# Patient Record
Sex: Female | Born: 1995
Health system: Southern US, Community
[De-identification: ages and names within clinical notes are randomized; demographics above are authoritative.]

## PROBLEM LIST (undated history)

## (undated) DIAGNOSIS — J302 Other seasonal allergic rhinitis: Secondary | ICD-10-CM

## (undated) DIAGNOSIS — O24419 Gestational diabetes mellitus in pregnancy, unspecified control: Secondary | ICD-10-CM

## (undated) HISTORY — PX: OTHER SURGICAL HISTORY: SHX169

---
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
## 2018-01-03 ENCOUNTER — Other Ambulatory Visit: Payer: Self-pay

## 2018-01-03 ENCOUNTER — Encounter (HOSPITAL_BASED_OUTPATIENT_CLINIC_OR_DEPARTMENT_OTHER): Payer: Self-pay

## 2018-01-03 ENCOUNTER — Emergency Department (HOSPITAL_BASED_OUTPATIENT_CLINIC_OR_DEPARTMENT_OTHER)
Admission: EM | Admit: 2018-01-03 | Discharge: 2018-01-03 | Disposition: A | Discharge status: Home / Self Care | Payer: Medicaid Other | Attending: Emergency Medicine | Admitting: Emergency Medicine

## 2018-01-03 DIAGNOSIS — N760 Acute vaginitis: Secondary | ICD-10-CM | POA: Diagnosis not present

## 2018-01-03 DIAGNOSIS — F17200 Nicotine dependence, unspecified, uncomplicated: Secondary | ICD-10-CM | POA: Diagnosis not present

## 2018-01-03 DIAGNOSIS — N939 Abnormal uterine and vaginal bleeding, unspecified: Secondary | ICD-10-CM | POA: Diagnosis not present

## 2018-01-03 DIAGNOSIS — B9689 Other specified bacterial agents as the cause of diseases classified elsewhere: Secondary | ICD-10-CM

## 2018-01-03 DIAGNOSIS — R102 Pelvic and perineal pain: Secondary | ICD-10-CM | POA: Insufficient documentation

## 2018-01-03 LAB — URINALYSIS, ROUTINE W REFLEX MICROSCOPIC
Bilirubin Urine: NEGATIVE
GLUCOSE, UA: NEGATIVE mg/dL
Ketones, ur: NEGATIVE mg/dL
Leukocytes, UA: NEGATIVE
Nitrite: NEGATIVE
PH: 6 (ref 5.0–8.0)
Protein, ur: NEGATIVE mg/dL
SPECIFIC GRAVITY, URINE: 1.025 (ref 1.005–1.030)

## 2018-01-03 LAB — WET PREP, GENITAL
Sperm: NONE SEEN
TRICH WET PREP: NONE SEEN
YEAST WET PREP: NONE SEEN

## 2018-01-03 LAB — URINALYSIS, MICROSCOPIC (REFLEX)

## 2018-01-03 LAB — PREGNANCY, URINE: Preg Test, Ur: NEGATIVE

## 2018-01-03 LAB — HCG, QUANTITATIVE, PREGNANCY: hCG, Beta Chain, Quant, S: <1 m[IU]/mL (ref <5)

## 2018-01-03 MED ORDER — NAPROXEN 375 MG PO TABS: 375.0000 mg | ORAL_TABLET | Freq: Two times a day (BID) | ORAL | 0 refills | Status: DC

## 2018-01-03 MED ORDER — METRONIDAZOLE 500 MG PO TABS: 500.0000 mg | ORAL_TABLET | Freq: Two times a day (BID) | ORAL | 0 refills | Status: DC

## 2018-01-03 MED ORDER — DICYCLOMINE HCL 20 MG PO TABS: 20.0000 mg | ORAL_TABLET | Freq: Two times a day (BID) | ORAL | 0 refills | Status: DC

## 2018-01-03 MED ORDER — ACETAMINOPHEN 500 MG PO TABS
1000.0000 mg | ORAL_TABLET | Freq: Once | ORAL | Status: AC
  Administered 2018-01-03: 1000 mg via ORAL
  Filled 2018-01-03: qty 2

## 2018-01-03 MED FILL — NAPROXEN 375 MG TABLET: 375 | 10 days supply | Qty: 20 | Fill #0

## 2018-01-03 MED FILL — metroNIDAZOLE 500 MG TABS: 500 | 7 days supply | Qty: 14 | Fill #0

## 2018-01-03 MED FILL — DICYCLOMINE 20 MG TABLET: 20 | 10 days supply | Qty: 20 | Fill #0

## 2018-01-03 NOTE — Discharge Instructions (Signed)
Please see the information and instructions below regarding your visit.  Your diagnoses today include:  1. Pelvic pain in female   2. BV (bacterial vaginosis)     Tests performed today include: See side panel of your discharge paperwork for testing performed today. Vital signs are listed at the bottom of these instructions.   Your wet prep demonstrated clue cells, which are an overgrowth of normal bacteria in the vagina.  Your gonorrhea, chlamydia, HIV, and syphilis testing will be available in the next 48 hours.  If anything is positive requires further action, you will receive a phone call.  Medications prescribed:    Take any prescribed medications only as prescribed, and any over the counter medications only as directed on the packaging.  Flagyl is an antibiotic used to treat bacterial vaginosis. This medication CANNOT be taken with alcohol, because it can cause nausea and vomiting combined with alcohol. Please also refrain from drinking alcohol for 48 hours after you finish the medicine.  Bentyl.  This is a medication to reduce spasming.  Naproxen.  This is a medication that helps with crampy lower abdominal pain.  This medication cannot be taken if trying to get pregnant.   Home care instructions:  Please follow any educational materials contained in this packet.   Follow-up instructions: Please follow-up with your primary care provider in 5-7 days for further evaluation of your symptoms if they are not completely improved.    Return instructions:  Please return to the Emergency Department if you experience worsening symptoms.  Please return to the emergency department if you have any worsening pain, nausea or vomiting that prevents you from keeping anything down, or significant increase in vaginal bleeding. Please return if you have any other emergent concerns.  Additional Information: If you wish to pursue contraceptive therapy, please discuss with your primary care  provider.  I also recommend wearing a condom with all sexual encounters to prevent any sexual transmitted infection transmission.  Your vital signs today were: BP 125/71 (BP Location: Right Arm)    Pulse 67    Temp 97.9 F (36.6 C) (Oral)    Resp 18    Ht 5\' 4"  (1.626 m)    Wt 92.5 kg (204 lb)    LMP 01/03/2018    SpO2 99%    BMI 35.02 kg/m  If your blood pressure (BP) was elevated on multiple readings during this visit above 130 for the top number or above 80 for the bottom number, please have this repeated by your primary care provider within one month. --------------  Thank you for allowing us to participate in your care today.

## 2018-01-03 NOTE — ED Triage Notes (Signed)
Pt states she is having pelvic/lower abd pain and vaginal bleeding x 1 hour-pt states she is due for menstrual period-NAD-steady gait

## 2018-01-03 NOTE — ED Notes (Signed)
ED Provider at bedside. 
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
## 2018-01-04 LAB — GC/CHLAMYDIA PROBE AMP (~~LOC~~) NOT AT ARMC
CHLAMYDIA, DNA PROBE: NEGATIVE
NEISSERIA GONORRHEA: NEGATIVE

## 2018-01-04 LAB — RPR: RPR: NONREACTIVE

## 2018-01-04 LAB — HIV ANTIBODY (ROUTINE TESTING W REFLEX): HIV SCREEN 4TH GENERATION: NONREACTIVE

## 2018-07-19 ENCOUNTER — Other Ambulatory Visit: Payer: Self-pay

## 2018-07-19 ENCOUNTER — Encounter (HOSPITAL_BASED_OUTPATIENT_CLINIC_OR_DEPARTMENT_OTHER): Payer: Self-pay | Admitting: *Deleted

## 2018-07-19 ENCOUNTER — Emergency Department (HOSPITAL_BASED_OUTPATIENT_CLINIC_OR_DEPARTMENT_OTHER)
Admission: EM | Admit: 2018-07-19 | Discharge: 2018-07-19 | Disposition: A | Discharge status: Home / Self Care | Payer: Medicaid Other | Attending: Emergency Medicine | Admitting: Emergency Medicine

## 2018-07-19 DIAGNOSIS — J111 Influenza due to unidentified influenza virus with other respiratory manifestations: Secondary | ICD-10-CM | POA: Insufficient documentation

## 2018-07-19 DIAGNOSIS — Z87891 Personal history of nicotine dependence: Secondary | ICD-10-CM | POA: Diagnosis not present

## 2018-07-19 DIAGNOSIS — R05 Cough: Secondary | ICD-10-CM | POA: Diagnosis present

## 2018-07-19 DIAGNOSIS — M7918 Myalgia, other site: Secondary | ICD-10-CM | POA: Insufficient documentation

## 2018-07-19 DIAGNOSIS — R69 Illness, unspecified: Secondary | ICD-10-CM

## 2018-07-19 DIAGNOSIS — Z3A09 9 weeks gestation of pregnancy: Secondary | ICD-10-CM | POA: Insufficient documentation

## 2018-07-19 DIAGNOSIS — O99511 Diseases of the respiratory system complicating pregnancy, first trimester: Secondary | ICD-10-CM | POA: Diagnosis not present

## 2018-07-19 MED ORDER — ACETAMINOPHEN 325 MG PO TABS
650.0000 mg | ORAL_TABLET | Freq: Once | ORAL | Status: AC
  Administered 2018-07-19: 650 mg via ORAL
  Filled 2018-07-19: qty 2

## 2018-07-19 NOTE — ED Provider Notes (Signed)
[facility] EMERGENCY DEPARTMENT Provider Note   CSN: [id number] Arrival date & time: 07/19/18  1608     History   Chief Complaint Chief Complaint  Patient presents with  . URI    HPI Wanda Boone is a 23 y.o. female.  23 y.o female G2P1 currently 9 weeks pregnany with no PMH presents to the ED with a chief complaint of cough, rhinorrhea and body aches x 3 days. She reports a clear to yellow sputum to her cough along with constant rhinorrhea and overall pain. She reports taking tylenol for her symptoms with no relieve. She also states not receiving her flu shot this season. She denies any chest pain, shortness of breath but reports feeling overall unwell.        Past Medical History:  Diagnosis Date  . Seasonal allergies     There are no active problems to display for this patient.   Past Surgical History:  Procedure Laterality Date  . arm surgery  cyst removed  . sweat gland surgery       OB History    Gravida  1   Para      Term      Preterm      AB      Living        SAB      TAB      Ectopic      Multiple      Live Births               Home Medications    Prior to Admission medications   Medication Sig Start Date End Date Taking? Authorizing Provider  dicyclomine (BENTYL) 20 MG tablet Take 1 tablet (20 mg total) by mouth 2 (two) times daily. 01/03/18   Aviva Kluver B, PA-C  naproxen (NAPROSYN) 375 MG tablet Take 1 tablet (375 mg total) by mouth 2 (two) times daily. 01/03/18   Elisha Ponder, PA-C    Family History History reviewed. No pertinent family history.  Social History Social History   Tobacco Use  . Smoking status: Former Games developer  . Smokeless tobacco: Never Used  Substance Use Topics  . Alcohol use: No  . Drug use: Never     Allergies   Cherry flavor   Review of Systems Review of Systems  Constitutional: Positive for fever.  HENT: Negative for sore throat.   Respiratory: Positive for cough.  Negative for shortness of breath.      Physical Exam Updated Vital Signs BP 105/63 (BP Location: Left Arm)   Pulse 97   Temp 99.8 F (37.7 C) (Oral)   Resp 18   Ht 5\' 4"  (1.626 m)   Wt 93 kg   LMP 05/15/2018   SpO2 100%   BMI 35.19 kg/m   Physical Exam Vitals signs and nursing note reviewed.  Constitutional:      General: She is not in acute distress.    Appearance: She is well-developed. She is ill-appearing.  HENT:     Head: Normocephalic and atraumatic.     Nose:     Right Turbinates: Enlarged and swollen.     Left Turbinates: Enlarged and swollen.     Right Sinus: Maxillary sinus tenderness and frontal sinus tenderness present.     Left Sinus: Maxillary sinus tenderness and frontal sinus tenderness present.     Mouth/Throat:     Mouth: Mucous membranes are moist.     Pharynx: Posterior oropharyngeal erythema present. No pharyngeal swelling  or oropharyngeal exudate.     Tonsils: No tonsillar exudate or tonsillar abscesses.     Comments: Oropharynx is clear slight erythema.  Eyes:     Pupils: Pupils are equal, round, and reactive to light.  Neck:     Musculoskeletal: Normal range of motion.  Cardiovascular:     Rate and Rhythm: Regular rhythm.     Heart sounds: Normal heart sounds.  Pulmonary:     Effort: Pulmonary effort is normal. No respiratory distress.     Breath sounds: Decreased breath sounds present. No wheezing.     Comments: No wheezing, rhonchi, rales.  Abdominal:     General: Bowel sounds are normal. There is no distension.     Palpations: Abdomen is soft.     Tenderness: There is no abdominal tenderness.  Musculoskeletal:        General: No tenderness or deformity.     Right lower leg: No edema.     Left lower leg: No edema.  Skin:    General: Skin is warm and dry.  Neurological:     Mental Status: She is alert and oriented to person, place, and time.      ED Treatments / Results  Labs (all labs ordered are listed, but only abnormal  results are displayed) Labs Reviewed - No data to display  EKG None  Radiology No results found.  Procedures Procedures (including critical care time)  Medications Ordered in ED Medications  acetaminophen (TYLENOL) tablet 650 mg (650 mg Oral Given 07/19/18 1715)     Initial Impression / Assessment and Plan / ED Course  I have reviewed the triage vital signs and the nursing notes.  Pertinent labs & imaging results that were available during my care of the patient were reviewed by me and considered in my medical decision making (see chart for details).     Resents with URI symptoms which began 2 days ago, fever along with rhinorrhea and cough, has tried Tylenol for her symptoms with no improvement.  She reports she would like to get some rest.  Patient is currently [redacted] weeks pregnant I have discussed the risks and benefits of Tamiflu and patient has denied a Tamiflu prescription at this time.  She is requesting Acacian for her headache however I have advised her that this time unable to provide anything but Tylenol due to be her being in the first trimester.  Her vitals have been stable she is afebrile at this moment, will have her follow-up with her PCP as needed encouraged to increase her fluid intake along with return if anything worsens.  Final Clinical Impressions(s) / ED Diagnoses   Final diagnoses:  Influenza-like illness    ED Discharge Orders    None       Claude Manges, Cordelia Poche 07/19/18 1952    Loren Racer, MD 07/21/18 [phone]

## 2018-07-19 NOTE — ED Triage Notes (Signed)
Pt c/o URi symptoms x 3 days  

## 2018-07-19 NOTE — Discharge Instructions (Addendum)
You may only take tylenol for your fever and symptoms. Please continue to hydrate with fluids and gatorade. Follow up with your primary care as needed.

## 2018-08-19 ENCOUNTER — Emergency Department (HOSPITAL_BASED_OUTPATIENT_CLINIC_OR_DEPARTMENT_OTHER)
Admission: EM | Admit: 2018-08-19 | Discharge: 2018-08-19 | Disposition: A | Discharge status: Home / Self Care | Payer: Medicaid Other | Attending: Emergency Medicine | Admitting: Emergency Medicine

## 2018-08-19 ENCOUNTER — Other Ambulatory Visit: Payer: Self-pay

## 2018-08-19 ENCOUNTER — Encounter (HOSPITAL_BASED_OUTPATIENT_CLINIC_OR_DEPARTMENT_OTHER): Payer: Self-pay | Admitting: Emergency Medicine

## 2018-08-19 DIAGNOSIS — A599 Trichomoniasis, unspecified: Secondary | ICD-10-CM | POA: Diagnosis not present

## 2018-08-19 DIAGNOSIS — G44219 Episodic tension-type headache, not intractable: Secondary | ICD-10-CM | POA: Diagnosis not present

## 2018-08-19 DIAGNOSIS — R51 Headache: Secondary | ICD-10-CM | POA: Diagnosis present

## 2018-08-19 DIAGNOSIS — Z87891 Personal history of nicotine dependence: Secondary | ICD-10-CM | POA: Diagnosis not present

## 2018-08-19 DIAGNOSIS — N3000 Acute cystitis without hematuria: Secondary | ICD-10-CM | POA: Diagnosis not present

## 2018-08-19 LAB — URINALYSIS, ROUTINE W REFLEX MICROSCOPIC
Bilirubin Urine: NEGATIVE
GLUCOSE, UA: NEGATIVE mg/dL
HGB URINE DIPSTICK: NEGATIVE
KETONES UR: NEGATIVE mg/dL
Nitrite: NEGATIVE
Protein, ur: NEGATIVE mg/dL
Specific Gravity, Urine: 1.02 (ref 1.005–1.030)
pH: 6 (ref 5.0–8.0)

## 2018-08-19 LAB — URINALYSIS, MICROSCOPIC (REFLEX): Squamous Epithelial / LPF: >50 (ref 0–5)

## 2018-08-19 MED ORDER — METRONIDAZOLE 500 MG PO TABS: 500.0000 mg | ORAL_TABLET | Freq: Two times a day (BID) | ORAL | 0 refills | Status: DC

## 2018-08-19 MED ORDER — CEPHALEXIN 500 MG PO CAPS: 500.0000 mg | ORAL_CAPSULE | Freq: Two times a day (BID) | ORAL | 0 refills | Status: DC

## 2018-08-19 MED ORDER — ONDANSETRON 4 MG PO TBDP: ORAL_TABLET | ORAL | 0 refills | Status: DC

## 2018-08-19 NOTE — ED Triage Notes (Signed)
Patient states that she has had a headache for 4 days  - the patient states that she is almost 14 weeks  - patient also reports that she is nauseated  - patient took tylenol 3 hours ago - the patient is talking and answering her phone while in triage and no noted neurological deficits

## 2018-08-19 NOTE — ED Notes (Signed)
Sprite given per pt request. 

## 2018-08-19 NOTE — ED Provider Notes (Signed)
[facility] EMERGENCY DEPARTMENT Provider Note   CSN: [id number] Arrival date & time: 08/19/18  1723     History   Chief Complaint Chief Complaint  Patient presents with  . Headache    HPI Wanda Boone is a 23 y.o. female.  Patient is a 23 year old female who presents with a headache.  She states for the last 4 days she has had intermittent headaches.  She describes an achy pain in her bifrontal area.  She states the headaches last about 2 to 3 hours but then usually subside after that.  She has been taking Tylenol with intermittent improvement in symptoms.  She denies any fevers.  No neck pain.  No head injuries recently.  No URI symptoms or sinus congestion.  No associated nausea or vomiting although she did have one episode of vomiting today which she does not know if it was related to the headache or because she is pregnant.  She is had no ongoing nausea.  No photophobia.  No history of similar headaches in the past.  No numbness or weakness to her extremities.  No facial numbness.  No problems with her balance.  No vision changes.  She is currently [redacted] weeks pregnant.  She is followed by [facility].     Past Medical History:  Diagnosis Date  . Seasonal allergies     There are no active problems to display for this patient.   Past Surgical History:  Procedure Laterality Date  . arm surgery  cyst removed  . sweat gland surgery       OB History    Gravida  1   Para      Term      Preterm      AB      Living        SAB      TAB      Ectopic      Multiple      Live Births               Home Medications    Prior to Admission medications   Medication Sig Start Date End Date Taking? Authorizing Provider  cephALEXin (KEFLEX) 500 MG capsule Take 1 capsule (500 mg total) by mouth 2 (two) times daily. 08/19/18   Rolan Bucco, MD  dicyclomine (BENTYL) 20 MG tablet Take 1 tablet (20 mg total) by mouth 2 (two) times daily. 01/03/18    Aviva Kluver B, PA-C  metroNIDAZOLE (FLAGYL) 500 MG tablet Take 1 tablet (500 mg total) by mouth 2 (two) times daily. One po bid x 7 days 08/19/18   Rolan Bucco, MD  naproxen (NAPROSYN) 375 MG tablet Take 1 tablet (375 mg total) by mouth 2 (two) times daily. 01/03/18   Aviva Kluver B, PA-C  ondansetron (ZOFRAN ODT) 4 MG disintegrating tablet 4mg  ODT q4 hours prn nausea/vomit 08/19/18   Rolan Bucco, MD    Family History History reviewed. No pertinent family history.  Social History Social History   Tobacco Use  . Smoking status: Former Games developer  . Smokeless tobacco: Never Used  Substance Use Topics  . Alcohol use: No  . Drug use: Never     Allergies   Cherry flavor   Review of Systems Review of Systems  Constitutional: Negative for chills, diaphoresis, fatigue and fever.  HENT: Negative for congestion, rhinorrhea and sneezing.   Eyes: Negative.   Respiratory: Negative for cough, chest tightness and shortness of breath.   Cardiovascular: Negative  for chest pain and leg swelling.  Gastrointestinal: Positive for nausea and vomiting (1 time today). Negative for abdominal pain, blood in stool and diarrhea.  Genitourinary: Negative for difficulty urinating, flank pain, frequency and hematuria.  Musculoskeletal: Negative for arthralgias and back pain.  Skin: Negative for rash.  Neurological: Positive for headaches. Negative for dizziness, speech difficulty, weakness and numbness.     Physical Exam Updated Vital Signs BP (!) 99/55   Pulse 72   Temp 98.4 F (36.9 C) (Oral)   Resp 16   Ht 5\' 4"  (1.626 m)   Wt 93 kg   LMP 05/15/2018   SpO2 100%   BMI 35.19 kg/m   Physical Exam Constitutional:      Appearance: She is well-developed.  HENT:     Head: Normocephalic and atraumatic.  Eyes:     Pupils: Pupils are equal, round, and reactive to light.     Comments: Fundi not well visualized, no photophobia  Neck:     Musculoskeletal: Normal range of motion and neck  supple.     Comments: No meningismus Cardiovascular:     Rate and Rhythm: Normal rate and regular rhythm.     Heart sounds: Normal heart sounds.  Pulmonary:     Effort: Pulmonary effort is normal. No respiratory distress.     Breath sounds: Normal breath sounds. No wheezing or rales.  Chest:     Chest wall: No tenderness.  Abdominal:     General: Bowel sounds are normal.     Palpations: Abdomen is soft.     Tenderness: There is no abdominal tenderness. There is no guarding or rebound.  Musculoskeletal: Normal range of motion.  Lymphadenopathy:     Cervical: No cervical adenopathy.  Skin:    General: Skin is warm and dry.     Findings: No rash.  Neurological:     Mental Status: She is alert and oriented to person, place, and time.     Comments: Motor 5/5 all extremities Sensation grossly intact to LT all extremities Finger to Nose intact, no pronator drift CN II-XII grossly intact Gait normal       ED Treatments / Results  Labs (all labs ordered are listed, but only abnormal results are displayed) Labs Reviewed  URINALYSIS, ROUTINE W REFLEX MICROSCOPIC - Abnormal; Notable for the following components:      Result Value   APPearance CLOUDY (*)    Leukocytes, UA SMALL (*)    All other components within normal limits  URINALYSIS, MICROSCOPIC (REFLEX) - Abnormal; Notable for the following components:   Bacteria, UA MANY (*)    Trichomonas, UA PRESENT (*)    All other components within normal limits    EKG None  Radiology No results found.  Procedures Procedures (including critical care time)  Medications Ordered in ED Medications - No data to display   Initial Impression / Assessment and Plan / ED Course  I have reviewed the triage vital signs and the nursing notes.  Pertinent labs & imaging results that were available during my care of the patient were reviewed by me and considered in my medical decision making (see chart for details).     Patient  presents with a intermittent bifrontal type headache.  She has no associated neck pain fever or other symptoms that would be more concerning for subarachnoid hemorrhage or meningitis.  She states her headache has almost resolved after taking Tylenol about 3 hours ago.  She has no neurologic deficits.  No ataxia.  No vision changes.  No other symptoms concerning for preeclampsia.  Her urinalysis did show evidence of a UTI as well as trichomonas.  She was started on Flagyl and Keflex.  She was encouraged to have close follow-up with her OB/GYN.  She was advised that her sexual partner needs to be treated as well.  Return precautions were given.  Final Clinical Impressions(s) / ED Diagnoses   Final diagnoses:  Episodic tension-type headache, not intractable  Trichimoniasis  Acute cystitis without hematuria    ED Discharge Orders         Ordered    metroNIDAZOLE (FLAGYL) 500 MG tablet  2 times daily     08/19/18 2241    cephALEXin (KEFLEX) 500 MG capsule  2 times daily     08/19/18 2241    ondansetron (ZOFRAN ODT) 4 MG disintegrating tablet     08/19/18 2241           Rolan BuccoBelfi, Kennette Cuthrell, MD 08/19/18 2242

## 2020-09-08 ENCOUNTER — Other Ambulatory Visit: Payer: Self-pay

## 2020-09-08 ENCOUNTER — Emergency Department (HOSPITAL_BASED_OUTPATIENT_CLINIC_OR_DEPARTMENT_OTHER)
Admission: EM | Admit: 2020-09-08 | Discharge: 2020-09-08 | Disposition: A | Discharge status: Home / Self Care | Payer: Medicaid Other | Attending: Emergency Medicine | Admitting: Emergency Medicine

## 2020-09-08 ENCOUNTER — Encounter (HOSPITAL_BASED_OUTPATIENT_CLINIC_OR_DEPARTMENT_OTHER): Payer: Self-pay | Admitting: Emergency Medicine

## 2020-09-08 DIAGNOSIS — K0889 Other specified disorders of teeth and supporting structures: Secondary | ICD-10-CM | POA: Insufficient documentation

## 2020-09-08 DIAGNOSIS — Z87891 Personal history of nicotine dependence: Secondary | ICD-10-CM | POA: Insufficient documentation

## 2020-09-08 MED ORDER — HYDROCODONE-ACETAMINOPHEN 5-325 MG PO TABS: 1.0000 | ORAL_TABLET | Freq: Four times a day (QID) | ORAL | 0 refills | Status: DC | PRN

## 2020-09-08 MED ORDER — FLUCONAZOLE 150 MG PO TABS: ORAL_TABLET | ORAL | 0 refills | Status: AC

## 2020-09-08 MED ORDER — BUPIVACAINE-EPINEPHRINE (PF) 0.5% -1:200000 IJ SOLN
1.8000 mL | Freq: Once | INTRAMUSCULAR | Status: AC
  Administered 2020-09-08: 1.8 mL
  Filled 2020-09-08: qty 1.8

## 2020-09-08 MED ORDER — AMOXICILLIN 500 MG PO CAPS: 500.0000 mg | ORAL_CAPSULE | Freq: Three times a day (TID) | ORAL | 0 refills | Status: AC

## 2020-09-08 MED ORDER — AMOXICILLIN 500 MG PO CAPS
1000.0000 mg | ORAL_CAPSULE | Freq: Once | ORAL | Status: AC
  Administered 2020-09-08: 1000 mg via ORAL
  Filled 2020-09-08: qty 2

## 2020-09-08 NOTE — ED Triage Notes (Signed)
Pt c/o tooth pain since yesterday

## 2020-09-08 NOTE — ED Provider Notes (Signed)
[facility] Provider Note: Lowella Dell, MD, FACEP  CSN: [id number] MRN: [id number] ARRIVAL: 09/08/20 at 0206 ROOM: MH10/MH10   CHIEF COMPLAINT  Dental Pain   HISTORY OF PRESENT ILLNESS  09/08/20 3:01 AM Wanda Boone is a 25 y.o. female with pain in her left upper central incisor since yesterday.  She rates her pain is a 10 out of 10, worse with eating or drinking.  She is not aware of any injury or caries associated with that tooth.  She does not have a Education officer, community.   Past Medical History:  Diagnosis Date  . Seasonal allergies     Past Surgical History:  Procedure Laterality Date  . arm surgery  cyst removed  . sweat gland surgery      No family history on file.  Social History   Tobacco Use  . Smoking status: Former Games developer  . Smokeless tobacco: Never Used  Substance Use Topics  . Alcohol use: No  . Drug use: Never    Prior to Admission medications   Medication Sig Start Date End Date Taking? Authorizing Provider  amoxicillin (AMOXIL) 500 MG capsule Take 1 capsule (500 mg total) by mouth 3 (three) times daily. 09/08/20  Yes Thania Woodlief, MD  fluconazole (DIFLUCAN) 150 MG tablet Take 1 tablet if needed for vaginal yeast infection.  May repeat in 3 days if symptoms persist. 09/08/20  Yes Eyana Stolze, Jonny Ruiz, MD  HYDROcodone-acetaminophen (NORCO) 5-325 MG tablet Take 1 tablet by mouth every 6 (six) hours as needed (for pain). 09/08/20  Yes Delphin Funes, MD  dicyclomine (BENTYL) 20 MG tablet Take 1 tablet (20 mg total) by mouth 2 (two) times daily. 01/03/18 09/08/20  Elisha Ponder, PA-C    Allergies Cherry flavor   REVIEW OF SYSTEMS  Negative except as noted here or in the History of Present Illness.   PHYSICAL EXAMINATION  Initial Vital Signs Blood pressure 112/67, pulse 80, temperature 98.3 F (36.8 C), temperature source Oral, resp. rate 20, height 5\' 4"  (1.626 m), weight 99.8 kg, SpO2 98 %.  Examination General: Well-developed, well-nourished  female in no acute distress; appearance consistent with age of record HENT: normocephalic; atraumatic; left upper central incisor tender to percussion, no obvious fracture or caries seen Eyes: pupils equal, round and reactive to light; extraocular muscles intact Neck: supple; no lymphadenopathy Heart: regular rate and rhythm Lungs: clear to auscultation bilaterally Abdomen: soft; nondistended; nontender; bowel sounds present Extremities: No deformity; full range of motion Neurologic: Awake, alert and oriented; motor function intact in all extremities and symmetric; no facial droop Skin: Warm and dry Psychiatric: Normal mood and affect   RESULTS  Summary of this visit's results, reviewed and interpreted by myself:   EKG Interpretation  Date/Time:    Ventricular Rate:    PR Interval:    QRS Duration:   QT Interval:    QTC Calculation:   R Axis:     Text Interpretation:        Laboratory Studies: No results found for this or any previous visit (from the past 24 hour(s)). Imaging Studies: No results found.  ED COURSE and MDM  Nursing notes, initial and subsequent vitals signs, including pulse oximetry, reviewed and interpreted by myself.  Vitals:   09/08/20 0214 09/08/20 0216  BP:  112/67  Pulse:  80  Resp:  20  Temp:  98.3 F (36.8 C)  TempSrc:  Oral  SpO2:  98%  Weight: 99.8 kg   Height: 5\' 4"  (1.626 m)  Medications  amoxicillin (AMOXIL) capsule 1,000 mg (has no administration in time range)  bupivacaine-epinephrine (MARCAINE W/ EPI) 0.5% -1:200000 injection 1.8 mL (1.8 mLs Infiltration Given 09/08/20 0308)    We will start on amoxicillin and analgesics and referred to the dentist on-call.  PROCEDURES  Procedures DENTAL BLOCK 1.8 milliliters of 0.5% bupivacaine with epinephrine were injected into the buccal fold adjacent to the left upper central incisor. The patient tolerated this well and there were no immediate complications. Adequate analgesia was  obtained.   ED DIAGNOSES     ICD-10-CM   1. Pain, dental  K08.89        Paula Libra, MD 09/08/20 [phone]

## 2021-05-18 ENCOUNTER — Emergency Department (HOSPITAL_BASED_OUTPATIENT_CLINIC_OR_DEPARTMENT_OTHER)
Admission: EM | Admit: 2021-05-18 | Discharge: 2021-05-18 | Disposition: A | Discharge status: Home / Self Care | Payer: Medicaid Other | Attending: Emergency Medicine | Admitting: Emergency Medicine

## 2021-05-18 ENCOUNTER — Other Ambulatory Visit: Payer: Self-pay

## 2021-05-18 ENCOUNTER — Encounter (HOSPITAL_BASED_OUTPATIENT_CLINIC_OR_DEPARTMENT_OTHER): Payer: Self-pay

## 2021-05-18 ENCOUNTER — Emergency Department (HOSPITAL_BASED_OUTPATIENT_CLINIC_OR_DEPARTMENT_OTHER): Payer: Medicaid Other

## 2021-05-18 DIAGNOSIS — Z91010 Allergy to peanuts: Secondary | ICD-10-CM | POA: Diagnosis not present

## 2021-05-18 DIAGNOSIS — Z87891 Personal history of nicotine dependence: Secondary | ICD-10-CM | POA: Insufficient documentation

## 2021-05-18 DIAGNOSIS — R42 Dizziness and giddiness: Secondary | ICD-10-CM | POA: Diagnosis not present

## 2021-05-18 DIAGNOSIS — J111 Influenza due to unidentified influenza virus with other respiratory manifestations: Secondary | ICD-10-CM

## 2021-05-18 DIAGNOSIS — R112 Nausea with vomiting, unspecified: Secondary | ICD-10-CM | POA: Diagnosis not present

## 2021-05-18 DIAGNOSIS — J1089 Influenza due to other identified influenza virus with other manifestations: Secondary | ICD-10-CM | POA: Insufficient documentation

## 2021-05-18 DIAGNOSIS — R8271 Bacteriuria: Secondary | ICD-10-CM | POA: Diagnosis not present

## 2021-05-18 LAB — BASIC METABOLIC PANEL
Anion gap: 12 (ref 5–15)
BUN: 9 mg/dL (ref 6–20)
CO2: 23 mmol/L (ref 22–32)
Calcium: 9.5 mg/dL (ref 8.9–10.3)
Chloride: 102 mmol/L (ref 98–111)
Creatinine, Ser: 0.73 mg/dL (ref 0.44–1.00)
GFR, Estimated: >60 mL/min (ref >60)
Glucose, Bld: 99 mg/dL (ref 70–99)
Potassium: 3.5 mmol/L (ref 3.5–5.1)
Sodium: 137 mmol/L (ref 135–145)

## 2021-05-18 LAB — URINALYSIS, ROUTINE W REFLEX MICROSCOPIC
Glucose, UA: NEGATIVE mg/dL
Ketones, ur: 40 mg/dL — AB
Nitrite: NEGATIVE
Protein, ur: 100 mg/dL — AB
Specific Gravity, Urine: <30 (ref 1.005–1.030)
pH: 6 (ref 5.0–8.0)

## 2021-05-18 LAB — CBC
HCT: 43.9 % (ref 36.0–46.0)
Hemoglobin: 15.2 g/dL — ABNORMAL HIGH (ref 12.0–15.0)
MCH: 31.7 pg (ref 26.0–34.0)
MCHC: 34.6 g/dL (ref 30.0–36.0)
MCV: 91.6 fL (ref 80.0–100.0)
Platelets: 285 10*3/uL (ref 150–400)
RBC: 4.79 MIL/uL (ref 3.87–5.11)
RDW: 11.6 % (ref 11.5–15.5)
WBC: 7.6 10*3/uL (ref 4.0–10.5)
nRBC: 0 % (ref 0.0–0.2)

## 2021-05-18 LAB — URINALYSIS, MICROSCOPIC (REFLEX)

## 2021-05-18 LAB — RAPID URINE DRUG SCREEN, HOSP PERFORMED
Amphetamines: NOT DETECTED
Barbiturates: NOT DETECTED
Benzodiazepines: NOT DETECTED
Cocaine: NOT DETECTED
Opiates: NOT DETECTED
Tetrahydrocannabinol: POSITIVE — AB

## 2021-05-18 LAB — PREGNANCY, URINE: Preg Test, Ur: NEGATIVE

## 2021-05-18 LAB — CBG MONITORING, ED: Glucose-Capillary: 98 mg/dL (ref 70–99)

## 2021-05-18 MED ORDER — SODIUM CHLORIDE 0.9 % IV BOLUS
1000.0000 mL | Freq: Once | INTRAVENOUS | Status: AC
  Administered 2021-05-18: 1000 mL via INTRAVENOUS

## 2021-05-18 MED ORDER — CEPHALEXIN 500 MG PO CAPS: 500.0000 mg | ORAL_CAPSULE | Freq: Two times a day (BID) | ORAL | 0 refills | Status: AC

## 2021-05-18 MED ORDER — DIPHENHYDRAMINE HCL 50 MG/ML IJ SOLN
25.0000 mg | Freq: Once | INTRAMUSCULAR | Status: AC
  Administered 2021-05-18: 25 mg via INTRAVENOUS
  Filled 2021-05-18: qty 1

## 2021-05-18 MED ORDER — DROPERIDOL 2.5 MG/ML IJ SOLN
2.5000 mg | Freq: Once | INTRAMUSCULAR | Status: AC
  Administered 2021-05-18: 2.5 mg via INTRAMUSCULAR
  Filled 2021-05-18: qty 2

## 2021-05-18 MED ORDER — ONDANSETRON HCL 4 MG/2ML IJ SOLN
4.0000 mg | Freq: Once | INTRAMUSCULAR | Status: AC
  Administered 2021-05-18: 4 mg via INTRAVENOUS
  Filled 2021-05-18: qty 2

## 2021-05-18 NOTE — ED Triage Notes (Signed)
Pt arrives ambulatory to ED with reports of testing positive for the flu last week states she has been vomiting and feeling dizzy since.

## 2021-05-18 NOTE — Discharge Instructions (Addendum)
Your urine could possibly be infected.  I have started you on antibiotics.  Would also continue to use the Zofran and Phenergan  Return for new or worsening symptoms

## 2021-05-18 NOTE — ED Provider Notes (Addendum)
[facility] EMERGENCY DEPARTMENT Provider Note   CSN: [id number] Arrival date & time: 05/18/21  1231    History Chief Complaint  Patient presents with   Influenza    Wanda Boone is a 25 y.o. female with a past medical history who presents for evaluation of feeling unwell.  Tested positive for flu at [facility] greater than a week ago.  States she has not felt well since.  States she has had intermittent vomiting and feels lightheaded when she goes from sitting to standing.  She was seen for similar at their facility.  States she has ODT Zofran as well as Phenergan at home which are not helping.  She denies any marijuana use, EtOH use.  No headache, head trauma, neck pain, neck stiffness, chest pain, shortness of breath abdominal pain, diarrhea, dysuria.  Denies additional aggravating or alleviating factors.  History obtained from patient and past medical records.  No interpreter is used.  HPI     Past Medical History:  Diagnosis Date   Seasonal allergies     There are no problems to display for this patient.   Past Surgical History:  Procedure Laterality Date   arm surgery  cyst removed   sweat gland surgery       OB History     Gravida  1   Para      Term      Preterm      AB      Living         SAB      IAB      Ectopic      Multiple      Live Births              No family history on file.  Social History   Tobacco Use   Smoking status: Former   Smokeless tobacco: Never  Building services engineer Use: Never used  Substance Use Topics   Alcohol use: No   Drug use: Never    Home Medications Prior to Admission medications   Medication Sig Start Date End Date Taking? Authorizing Provider  amoxicillin (AMOXIL) 500 MG capsule Take 1 capsule (500 mg total) by mouth 3 (three) times daily. 09/08/20   Molpus, Jonny Ruiz, MD  cephALEXin (KEFLEX) 500 MG capsule Take 1 capsule (500 mg total) by mouth 2 (two) times daily for 7 days. 05/18/21  05/25/21 Yes Sophie Tamez A, PA-C  fluconazole (DIFLUCAN) 150 MG tablet Take 1 tablet if needed for vaginal yeast infection.  May repeat in 3 days if symptoms persist. 09/08/20   Molpus, Jonny Ruiz, MD  HYDROcodone-acetaminophen (NORCO) 5-325 MG tablet Take 1 tablet by mouth every 6 (six) hours as needed (for pain). 09/08/20   Molpus, John, MD  dicyclomine (BENTYL) 20 MG tablet Take 1 tablet (20 mg total) by mouth 2 (two) times daily. 01/03/18 09/08/20  Aviva Kluver B, PA-C    Allergies    Peanut butter flavor, Red dye, and Cherry flavor  Review of Systems   Review of Systems  HENT: Negative.    Respiratory: Negative.    Cardiovascular: Negative.   Gastrointestinal:  Positive for nausea and vomiting. Negative for abdominal distention, abdominal pain, anal bleeding, constipation, diarrhea and rectal pain.  Genitourinary: Negative.   Musculoskeletal: Negative.   Skin: Negative.   Neurological: Negative.   All other systems reviewed and are negative.  Physical Exam Updated Vital Signs BP 119/85   Pulse 78   Temp 99.1 F (  37.3 C) (Oral)   Resp 16   Ht 5\' 4"  (1.626 m)   Wt 99.8 kg   LMP 05/17/2021   SpO2 100%   BMI 37.76 kg/m   Physical Exam Vitals and nursing note reviewed.  Constitutional:      General: She is not in acute distress.    Appearance: She is well-developed. She is not ill-appearing.  HENT:     Head: Normocephalic and atraumatic.     Jaw: There is normal jaw occlusion.     Nose: Nose normal.     Mouth/Throat:     Mouth: Mucous membranes are moist.  Eyes:     Pupils: Pupils are equal, round, and reactive to light.  Neck:     Trachea: Trachea and phonation normal.     Comments: Full ROM without difficulty Cardiovascular:     Rate and Rhythm: Normal rate.     Pulses: Normal pulses.          Radial pulses are 2+ on the right side and 2+ on the left side.       Dorsalis pedis pulses are 2+ on the right side.     Heart sounds: Normal heart sounds.  Pulmonary:      Effort: Pulmonary effort is normal. No respiratory distress.     Breath sounds: Normal breath sounds and air entry.     Comments: Clear bil, speaking in full sentences without difficulty Abdominal:     General: Bowel sounds are normal. There is no distension.     Palpations: Abdomen is soft.     Tenderness: There is no abdominal tenderness. There is no right CVA tenderness, left CVA tenderness, guarding or rebound.     Comments: Non tender  Musculoskeletal:        General: No swelling or tenderness. Normal range of motion.     Cervical back: Full passive range of motion without pain and normal range of motion.     Comments: Full ROM without difficulty. No posterior calf tenderness  Skin:    General: Skin is warm and dry.     Capillary Refill: Capillary refill takes less than 2 seconds.     Comments: No obvious rash  Neurological:     General: No focal deficit present.     Mental Status: She is alert.     Cranial Nerves: Cranial nerves 2-12 are intact.     Comments: Cn 2-12 grossly intact. Ambulatory without ataxia  Psychiatric:        Mood and Affect: Mood normal.    ED Results / Procedures / Treatments   Labs (all labs ordered are listed, but only abnormal results are displayed) Labs Reviewed  CBC - Abnormal; Notable for the following components:      Result Value   Hemoglobin 15.2 (*)    All other components within normal limits  URINALYSIS, ROUTINE W REFLEX MICROSCOPIC - Abnormal; Notable for the following components:   Color, Urine AMBER (*)    APPearance CLOUDY (*)    Hgb urine dipstick TRACE (*)    Bilirubin Urine MODERATE (*)    Ketones, ur 40 (*)    Protein, ur 100 (*)    Leukocytes,Ua TRACE (*)    All other components within normal limits  RAPID URINE DRUG SCREEN, HOSP PERFORMED - Abnormal; Notable for the following components:   Tetrahydrocannabinol POSITIVE (*)    All other components within normal limits  URINALYSIS, MICROSCOPIC (REFLEX) - Abnormal; Notable  for the following components:  Bacteria, UA MANY (*)    All other components within normal limits  BASIC METABOLIC PANEL  PREGNANCY, URINE  CBG MONITORING, ED    EKG EKG Interpretation  Date/Time:  Tuesday May 18 2021 12:42:36 EDT Ventricular Rate:  93 PR Interval:  166 QRS Duration: 84 QT Interval:  362 QTC Calculation: 450 R Axis:   71 Text Interpretation: Normal sinus rhythm Confirmed by Virgina Norfolk ([id number]) on 05/18/2021 1:24:50 PM  Radiology DG Chest Portable 1 View  Result Date: 05/18/2021 CLINICAL DATA:  Cough. EXAM: PORTABLE CHEST 1 VIEW COMPARISON:  None. FINDINGS: The heart size and mediastinal contours are within normal limits. Both lungs are clear. The visualized skeletal structures are unremarkable. IMPRESSION: No active disease. Electronically Signed   By: Signa Kell M.D.   On: 05/18/2021 13:42    Procedures Procedures   Medications Ordered in ED Medications  sodium chloride 0.9 % bolus 1,000 mL (0 mLs Intravenous Stopped 05/18/21 1549)  ondansetron (ZOFRAN) injection 4 mg (4 mg Intravenous Given 05/18/21 1437)  sodium chloride 0.9 % bolus 1,000 mL (1,000 mLs Intravenous New Bag/Given 05/18/21 1622)  droperidol (INAPSINE) 2.5 MG/ML injection 2.5 mg (2.5 mg Intramuscular Given 05/18/21 1623)  diphenhydrAMINE (BENADRYL) injection 25 mg (25 mg Intravenous Given 05/18/21 1656)   ED Course  I have reviewed the triage vital signs and the nursing notes.  Pertinent labs & imaging results that were available during my care of the patient were reviewed by me and considered in my medical decision making (see chart for details).  Here for evaluation of feeling unwell with nausea and emesis.  Diagnosed with flu 1 week ago.  He is afebrile, nonseptic, not ill-appearing.  Does have some orthostatic lightheadedness.  States she has tried Zofran and Phenergan at home without relief of her emesis.  No abdominal pain, denies chance of pregnancy.  No urinary complaints.  Heart  and lungs clear.  Abdomen soft, nontender.  No headache, head trauma.  Denies any illicit substance use. Plan on labs, imaging and reassess.  Labs and imaging personally reviewed and interpreted:  CBC without leukocytosis Metabolic panel without electrolyte abnormality Preg test negative UA with leuks, many bacteria. Will treat for UTI.>> Denies concern for STD  Patient reassessed.  Received 2 L IV fluids.  UDS is positive for THC, suspect some degree of cannabinoid hyperemesis contributing.  Encourage continuing Zofran, Phenergan at home, ABX for UTI with rest and close follow-up outpatient with PCP.  No indication of appendicitis, bowel obstruction, bowel perforation, cholecystitis, diverticulitis, PID or ectopic pregnancy.    The patient has been appropriately medically screened and/or stabilized in the ED. I have low suspicion for any other emergent medical condition which would require further screening, evaluation or treatment in the ED or require inpatient management.  Patient is hemodynamically stable and in no acute distress.  Patient able to ambulate in department prior to ED.  Evaluation does not show acute pathology that would require ongoing or additional emergent interventions while in the emergency department or further inpatient treatment.  I have discussed the diagnosis with the patient and answered all questions.  Pain is been managed while in the emergency department and patient has no further complaints prior to discharge.  Patient is comfortable with plan discussed in room and is stable for discharge at this time.  I have discussed strict return precautions for returning to the emergency department.  Patient was encouraged to follow-up with PCP/specialist refer to at discharge.      MDM  Rules/Calculators/A&P                            Final Clinical Impression(s) / ED Diagnoses Final diagnoses:  Nausea and vomiting, unspecified vomiting type  Influenza  Bacteria in  urine    Rx / DC Orders ED Discharge Orders          Ordered    cephALEXin (KEFLEX) 500 MG capsule  2 times daily        05/18/21 1810             Nicolaas Savo A, PA-C 05/18/21 1816    Perrin Gens A, PA-C 05/18/21 1816    Curatolo, Adam, DO 05/21/21 0710

## 2022-03-24 ENCOUNTER — Emergency Department (HOSPITAL_BASED_OUTPATIENT_CLINIC_OR_DEPARTMENT_OTHER)
Admission: EM | Admit: 2022-03-24 | Discharge: 2022-03-24 | Disposition: A | Discharge status: Home / Self Care | Payer: Medicaid Other | Attending: Emergency Medicine | Admitting: Emergency Medicine

## 2022-03-24 ENCOUNTER — Other Ambulatory Visit: Payer: Self-pay

## 2022-03-24 ENCOUNTER — Encounter (HOSPITAL_BASED_OUTPATIENT_CLINIC_OR_DEPARTMENT_OTHER): Payer: Self-pay | Admitting: Emergency Medicine

## 2022-03-24 DIAGNOSIS — O219 Vomiting of pregnancy, unspecified: Secondary | ICD-10-CM | POA: Insufficient documentation

## 2022-03-24 DIAGNOSIS — Z20822 Contact with and (suspected) exposure to covid-19: Secondary | ICD-10-CM | POA: Insufficient documentation

## 2022-03-24 DIAGNOSIS — R112 Nausea with vomiting, unspecified: Secondary | ICD-10-CM

## 2022-03-24 DIAGNOSIS — Z3A01 Less than 8 weeks gestation of pregnancy: Secondary | ICD-10-CM | POA: Diagnosis not present

## 2022-03-24 LAB — RESP PANEL BY RT-PCR (FLU A&B, COVID) ARPGX2
Influenza A by PCR: NEGATIVE
Influenza B by PCR: NEGATIVE
SARS Coronavirus 2 by RT PCR: NEGATIVE

## 2022-03-24 MED ORDER — DOXYLAMINE-PYRIDOXINE 10-10 MG PO TBEC: 1.0000 | DELAYED_RELEASE_TABLET | Freq: Three times a day (TID) | ORAL | 0 refills | Status: DC | PRN

## 2022-03-24 MED ORDER — ONDANSETRON 4 MG PO TBDP
4.0000 mg | ORAL_TABLET | Freq: Once | ORAL | Status: AC
Start: 2022-03-24 — End: 2022-03-24
  Administered 2022-03-24: 4 mg via ORAL
  Filled 2022-03-24: qty 1

## 2022-03-24 NOTE — ED Provider Notes (Signed)
Emergency Department Provider Note   I have reviewed the triage vital signs and the nursing notes.   HISTORY  Chief Complaint Emesis   HPI Wanda Boone is a 26 y.o. female presents to the ED with 1 week of URI symptoms and vomiting. She is in early pregnancy and has been around others with similar symptoms. No abdominal pain, UTI symptoms, or vaginal bleeding/discharge. No CP or SOB.    Past Medical History:  Diagnosis Date   Seasonal allergies     Review of Systems  Constitutional: No fever/chills. Positive nasal congestion.  Eyes: No visual changes. ENT: No sore throat. Cardiovascular: Denies chest pain. Respiratory: Denies shortness of breath. Gastrointestinal: No abdominal pain. Positive nausea and vomiting.  No diarrhea.  No constipation. Genitourinary: Negative for dysuria. Musculoskeletal: Negative for back pain. Skin: Negative for rash. Neurological: Negative for headaches, focal weakness or numbness.  ____________________________________________   PHYSICAL EXAM:  VITAL SIGNS: ED Triage Vitals  Enc Vitals Group     BP 03/24/22 0821 117/83     Pulse Rate 03/24/22 0821 66     Resp 03/24/22 0821 18     Temp 03/24/22 0821 98.3 F (36.8 C)     Temp Source 03/24/22 0821 Oral     SpO2 03/24/22 0821 100 %     Weight 03/24/22 0824 214 lb (97.1 kg)     Height 03/24/22 0824 5\' 4"  (1.626 m)   Constitutional: Alert and oriented. Well appearing and in no acute distress. Eyes: Conjunctivae are normal. Head: Atraumatic. Nose: Mild congestion/rhinnorhea. Mouth/Throat: Mucous membranes are moist.   Neck: No stridor.   Cardiovascular: Normal rate, regular rhythm. Good peripheral circulation. Grossly normal heart sounds.   Respiratory: Normal respiratory effort.  No retractions. Lungs CTAB. Gastrointestinal: Soft and nontender. No distention.  Musculoskeletal: No lower extremity tenderness nor edema. No gross deformities of extremities. Neurologic:  Normal  speech and language. No gross focal neurologic deficits are appreciated.  Skin:  Skin is warm, dry and intact. No rash noted.  ____________________________________________   LABS (all labs ordered are listed, but only abnormal results are displayed)  Labs Reviewed  RESP PANEL BY RT-PCR (FLU A&B, COVID) [id number]   ____________________________________________   PROCEDURES  Procedure(s) performed:   Procedures  None ____________________________________________   INITIAL IMPRESSION / ASSESSMENT AND PLAN / ED COURSE  Pertinent labs & imaging results that were available during my care of the patient were reviewed by me and considered in my medical decision making (see chart for details).   This patient is Presenting for Evaluation of URI, which does require a range of treatment options, and is a complaint that involves a moderate risk of morbidity and mortality.  The Differential Diagnoses include COVID, Flu, CAP, hyperemesis, GI viral infection, etc.  Critical Interventions-    Medications  ondansetron (ZOFRAN-ODT) disintegrating tablet 4 mg (4 mg Oral Given 03/24/22 0843)    Reassessment after intervention: Patient feeling "much better!"     Clinical Laboratory Tests Ordered, included COVID and Flu PCR are negative.    Medical Decision Making: Summary:  Patient with URI symptoms and vomiting. Abdomen soft and non-tender. No subjective abdominal pain. COVID and Flu tests negative. Plan for supportive care and close PCP/OB follow up.   Disposition: discharge  ____________________________________________  FINAL CLINICAL IMPRESSION(S) / ED DIAGNOSES  Final diagnoses:  Nausea and vomiting, unspecified vomiting type     NEW OUTPATIENT MEDICATIONS STARTED DURING THIS VISIT:  Discharge Medication List as of 03/24/2022 10:04 AM  START taking these medications   Details  Doxylamine-Pyridoxine 10-10 MG TBEC Take 1 tablet by mouth every 8 (eight) hours as needed.,  Starting Thu 03/24/2022, Normal        Note:  This document was prepared using Dragon voice recognition software and may include unintentional dictation errors.  Alona Bene, MD, [facility] Emergency Medicine    Kerrion Kemppainen, Arlyss Repress, MD 03/30/22 [phone]

## 2022-03-24 NOTE — ED Triage Notes (Signed)
Reports Emesis , persistent nausea , dizziness , runny nose , cough x 1 week, , reports been around individuals with similar symptoms .  Report [redacted] weeks pregnant

## 2022-03-24 NOTE — Discharge Instructions (Signed)
Call your OB/GYN for follow-up.  Return with any new or suddenly worsening symptoms.

## 2022-08-05 ENCOUNTER — Emergency Department (HOSPITAL_BASED_OUTPATIENT_CLINIC_OR_DEPARTMENT_OTHER): Payer: Medicaid Other

## 2022-08-05 ENCOUNTER — Other Ambulatory Visit: Payer: Self-pay

## 2022-08-05 ENCOUNTER — Emergency Department (HOSPITAL_BASED_OUTPATIENT_CLINIC_OR_DEPARTMENT_OTHER)
Admission: EM | Admit: 2022-08-05 | Discharge: 2022-08-05 | Disposition: A | Discharge status: Home / Self Care | Payer: Medicaid Other | Attending: Emergency Medicine | Admitting: Emergency Medicine

## 2022-08-05 DIAGNOSIS — W25XXXA Contact with sharp glass, initial encounter: Secondary | ICD-10-CM | POA: Diagnosis not present

## 2022-08-05 DIAGNOSIS — Z23 Encounter for immunization: Secondary | ICD-10-CM | POA: Insufficient documentation

## 2022-08-05 DIAGNOSIS — Z91010 Allergy to peanuts: Secondary | ICD-10-CM | POA: Diagnosis not present

## 2022-08-05 DIAGNOSIS — Y93G1 Activity, food preparation and clean up: Secondary | ICD-10-CM | POA: Diagnosis not present

## 2022-08-05 DIAGNOSIS — S61210A Laceration without foreign body of right index finger without damage to nail, initial encounter: Secondary | ICD-10-CM | POA: Insufficient documentation

## 2022-08-05 DIAGNOSIS — S6991XA Unspecified injury of right wrist, hand and finger(s), initial encounter: Secondary | ICD-10-CM | POA: Diagnosis present

## 2022-08-05 MED ORDER — TETANUS-DIPHTH-ACELL PERTUSSIS 5-2.5-18.5 LF-MCG/0.5 IM SUSY
0.5000 mL | PREFILLED_SYRINGE | Freq: Once | INTRAMUSCULAR | Status: AC
  Administered 2022-08-05: 0.5 mL via INTRAMUSCULAR
  Filled 2022-08-05: qty 0.5

## 2022-08-05 NOTE — ED Notes (Addendum)
Washing dishes the pt. Cut the R knuckle on the R index hand  No bleeding noted

## 2022-08-05 NOTE — ED Triage Notes (Signed)
Pt was washing dishes last night and a glass broke and lacerated first finger of right hand.  Bleeding controlled.  Does not know last tetanus.

## 2022-08-05 NOTE — Discharge Instructions (Addendum)
You were evaluated today for your finger laceration.  This was repaired with surgical adhesive.  Please allow this adhesive to fall off on its own over the next 1 to 2 weeks.  After 12 hours you may begin to get the hand wet again.  If the glue falls off that is okay.  No further repair should be required.  You are also administered a tetanus booster shot at today's visit.

## 2022-08-05 NOTE — ED Provider Notes (Signed)
[place] [facility] Provider Note   CSN: [id number] Arrival date & time: 08/05/22  1442     History  Chief Complaint  Patient presents with   Laceration    Wanda Boone is a 27 y.o. female.  Patient presents to the emergency department complaining of laceration to the dorsal surface of the right hand near the second MCP.  Patient states that she was washing dishes last night at approximately 7 or 8 PM when a glass broke, lacerating the finger.  She is unsure of her last tetanus vaccine.  Bleeding is controlled upon arrival.  Past medical history significant for seasonal allergies.  HPI     Home Medications Prior to Admission medications   Medication Sig Start Date End Date Taking? Authorizing Provider  amoxicillin (AMOXIL) 500 MG capsule Take 1 capsule (500 mg total) by mouth 3 (three) times daily. 09/08/20   Molpus, John, MD  Doxylamine-Pyridoxine 10-10 MG TBEC Take 1 tablet by mouth every 8 (eight) hours as needed. 03/24/22   Long, Arlyss Repress, MD  fluconazole (DIFLUCAN) 150 MG tablet Take 1 tablet if needed for vaginal yeast infection.  May repeat in 3 days if symptoms persist. 09/08/20   Molpus, Jonny Ruiz, MD  HYDROcodone-acetaminophen (NORCO) 5-325 MG tablet Take 1 tablet by mouth every 6 (six) hours as needed (for pain). 09/08/20   Molpus, John, MD  dicyclomine (BENTYL) 20 MG tablet Take 1 tablet (20 mg total) by mouth 2 (two) times daily. 01/03/18 09/08/20  Aviva Kluver B, PA-C      Allergies    Peanut butter flavor, Red dye, and Cherry flavor    Review of Systems   Review of Systems  Skin:  Positive for wound.    Physical Exam Updated Vital Signs BP 114/73 (BP Location: Left Arm)   Pulse (!) 105   Temp 98.5 F (36.9 C) (Oral)   Resp 18   LMP 02/11/2022 (Exact Date)   SpO2 99%  Physical Exam Vitals and nursing note reviewed.  HENT:     Head: Normocephalic and atraumatic.  Eyes:     Pupils: Pupils are equal, round, and reactive  to light.  Pulmonary:     Effort: Pulmonary effort is normal. No respiratory distress.  Musculoskeletal:        General: No signs of injury. Normal range of motion.     Cervical back: Normal range of motion.  Skin:    General: Skin is dry.     Comments: 1 cm vertical laceration at second MCP of right hand on dorsal side  Neurological:     Mental Status: She is alert.  Psychiatric:        Speech: Speech normal.        Behavior: Behavior normal.     ED Results / Procedures / Treatments   Labs (all labs ordered are listed, but only abnormal results are displayed) Labs Reviewed - No data to display  EKG None  Radiology DG Hand Complete Right  Result Date: 08/05/2022 CLINICAL DATA:  Injury. Washing dishes last night and glass broke and lacerated index finger. [place] for foreign body. EXAM: RIGHT HAND - COMPLETE 3+ VIEW COMPARISON:  None Available. FINDINGS: Normal bone mineralization. Joint spaces are preserved. No acute fracture is seen. No dislocation. Gauze overlies the index finger. Within this limitation, no definite radiopaque foreign body is seen. IMPRESSION: Gauze overlies the index finger. Within this limitation, no definite radiopaque foreign body is seen. Electronically Signed   By:  Yvonne Kendall M.D.   On: 08/05/2022 15:33    Procedures .Marland KitchenLaceration Repair  Date/Time: 08/05/2022 3:32 PM  Performed by: Dorothyann Peng, PA-C Authorized by: Dorothyann Peng, PA-C   Consent:    Consent obtained:  Verbal   Consent given by:  Patient   Risks, benefits, and alternatives were discussed: yes     Risks discussed:  Pain, infection, retained foreign body, poor cosmetic result, need for additional repair and poor wound healing   Alternatives discussed:  No treatment Universal protocol:    Procedure explained and questions answered to patient or proxy's satisfaction: yes     Relevant documents present and verified: yes     Imaging studies available: yes     Required  blood products, implants, devices, and special equipment available: yes     Immediately prior to procedure, a time out was called: yes     Patient identity confirmed:  Verbally with patient Anesthesia:    Anesthesia method:  None Laceration details:    Location:  Finger   Finger location:  R index finger   Length (cm):  1   Depth (mm):  2 Pre-procedure details:    Preparation:  Imaging obtained to evaluate for foreign bodies and patient was prepped and draped in usual sterile fashion Exploration:    Limited defect created (wound extended): no     Hemostasis achieved with:  Direct pressure   Imaging obtained: x-ray     Imaging outcome: foreign body not noted     Wound exploration: entire depth of wound visualized     Contaminated: no   Treatment:    Area cleansed with:  Saline   Amount of cleaning:  Standard Skin repair:    Repair method:  Tissue adhesive Approximation:    Approximation:  Close Repair type:    Repair type:  Simple Post-procedure details:    Dressing:  Non-adherent dressing   Procedure completion:  Tolerated well, no immediate complications     Medications Ordered in ED Medications  Tdap (BOOSTRIX) injection 0.5 mL (0.5 mLs Intramuscular Given 08/05/22 1603)    ED Course/ Medical Decision Making/ A&P                             Medical Decision Making Amount and/or Complexity of Data Reviewed Radiology: ordered.  Risk Prescription drug management.   This patient presents to the ED for concern of laceration, this involves an extensive number of treatment options, and is a complaint that carries with it a high risk of complications and morbidity.    Co morbidities that complicate the patient evaluation  None    Imaging Studies ordered:  I ordered imaging studies including plain films of the right hand to evaluate for any foreign objects I independently visualized and interpreted imaging which showed no foreign body, fracture, dislocation I  agree with the radiologist interpretation  Problem List / ED Course / Critical interventions / Medication management   I ordered medication including tetanus booster for prophylaxis I have reviewed the patients home medicines and have made adjustments as needed   Test / Admission - Considered:  The patient is a small laceration to the dorsal portion of the second CP of the right hand.  This was repaired with surgical adhesive.  Please see procedure note.  The patient tolerated the procedure without difficulty.  Patient be discharged home at this time with instructions for laceration care  Final Clinical Impression(s) / ED Diagnoses Final diagnoses:  Laceration of right index finger without foreign body without damage to nail, initial encounter    Rx / DC Orders ED Discharge Orders     None         Ronny Bacon 08/05/22 [place], DO 08/06/22 2235

## 2022-08-14 ENCOUNTER — Other Ambulatory Visit: Payer: Self-pay

## 2022-08-14 ENCOUNTER — Emergency Department (HOSPITAL_BASED_OUTPATIENT_CLINIC_OR_DEPARTMENT_OTHER)
Admission: EM | Admit: 2022-08-14 | Discharge: 2022-08-14 | Disposition: A | Discharge status: Home / Self Care | Payer: Medicaid Other | Attending: Emergency Medicine | Admitting: Emergency Medicine

## 2022-08-14 ENCOUNTER — Encounter (HOSPITAL_BASED_OUTPATIENT_CLINIC_OR_DEPARTMENT_OTHER): Payer: Self-pay | Admitting: Emergency Medicine

## 2022-08-14 DIAGNOSIS — U071 COVID-19: Secondary | ICD-10-CM | POA: Diagnosis not present

## 2022-08-14 DIAGNOSIS — Z91010 Allergy to peanuts: Secondary | ICD-10-CM | POA: Insufficient documentation

## 2022-08-14 DIAGNOSIS — R197 Diarrhea, unspecified: Secondary | ICD-10-CM | POA: Diagnosis not present

## 2022-08-14 DIAGNOSIS — R112 Nausea with vomiting, unspecified: Secondary | ICD-10-CM | POA: Diagnosis present

## 2022-08-14 LAB — URINALYSIS, ROUTINE W REFLEX MICROSCOPIC
Bilirubin Urine: NEGATIVE
Glucose, UA: NEGATIVE mg/dL
Ketones, ur: NEGATIVE mg/dL
Leukocytes,Ua: NEGATIVE
Nitrite: NEGATIVE
Protein, ur: 30 mg/dL — AB
Specific Gravity, Urine: >=1.03 (ref 1.005–1.030)
pH: 6 (ref 5.0–8.0)

## 2022-08-14 LAB — RESP PANEL BY RT-PCR (RSV, FLU A&B, COVID)  RVPGX2
Influenza A by PCR: NEGATIVE
Influenza B by PCR: NEGATIVE
Resp Syncytial Virus by PCR: NEGATIVE
SARS Coronavirus 2 by RT PCR: POSITIVE — AB

## 2022-08-14 LAB — URINALYSIS, MICROSCOPIC (REFLEX)

## 2022-08-14 LAB — PREGNANCY, URINE: Preg Test, Ur: NEGATIVE

## 2022-08-14 MED ORDER — PROMETHAZINE HCL 25 MG PO TABS
12.5000 mg | ORAL_TABLET | Freq: Once | ORAL | Status: AC
  Administered 2022-08-14: 12.5 mg via ORAL
  Filled 2022-08-14: qty 1

## 2022-08-14 MED ORDER — ONDANSETRON 4 MG PO TBDP
4.0000 mg | ORAL_TABLET | Freq: Once | ORAL | Status: DC
  Filled 2022-08-14: qty 1

## 2022-08-14 MED ORDER — SODIUM CHLORIDE 0.9 % IV BOLUS
1000.0000 mL | Freq: Once | INTRAVENOUS | Status: AC
  Administered 2022-08-14: 1000 mL via INTRAVENOUS

## 2022-08-14 MED ORDER — PROMETHAZINE HCL 12.5 MG PO TABS: 12.5000 mg | ORAL_TABLET | Freq: Four times a day (QID) | ORAL | 0 refills | Status: AC | PRN

## 2022-08-14 NOTE — ED Triage Notes (Signed)
Pt arrives pov steady gait with c/o n/v/d x 2 days, occurs after po intake. Pt denies abdominal pain. Also endorses nasal congestion

## 2022-08-14 NOTE — ED Provider Notes (Signed)
[facility] Provider Note   CSN: [id number] Arrival date & time: 08/14/22  7371     History  Chief Complaint  Patient presents with   Emesis    Wanda Boone is a 27 y.o. female, no pertinent past medical history, who presents to the ED secondary to fever of 100.4, nausea, vomiting, and diarrhea for the last 2 days.  She states she has also had some myalgias.  States that she has had 4-5 episodes of vomiting daily, and then started having some diarrhea yesterday.  Just feels weak and unwell and like she cannot keep anything down.  Denies any urinary symptoms, no abdominal pain, cough, sore throat.  Has not taken anything for symptoms.  Vomit and diarrhea are nonbloody, and she denies any recent travel.     Home Medications Prior to Admission medications   Medication Sig Start Date End Date Taking? Authorizing Provider  promethazine (PHENERGAN) 12.5 MG tablet Take 1 tablet (12.5 mg total) by mouth every 6 (six) hours as needed for nausea or vomiting. 08/14/22  Yes Rudie Sermons L, PA  amoxicillin (AMOXIL) 500 MG capsule Take 1 capsule (500 mg total) by mouth 3 (three) times daily. 09/08/20   Molpus, John, MD  Doxylamine-Pyridoxine 10-10 MG TBEC Take 1 tablet by mouth every 8 (eight) hours as needed. 03/24/22   Long, Wonda Olds, MD  fluconazole (DIFLUCAN) 150 MG tablet Take 1 tablet if needed for vaginal yeast infection.  May repeat in 3 days if symptoms persist. 09/08/20   Molpus, Jenny Reichmann, MD  HYDROcodone-acetaminophen (NORCO) 5-325 MG tablet Take 1 tablet by mouth every 6 (six) hours as needed (for pain). 09/08/20   Molpus, John, MD  dicyclomine (BENTYL) 20 MG tablet Take 1 tablet (20 mg total) by mouth 2 (two) times daily. 01/03/18 09/08/20  Langston Masker B, PA-C      Allergies    Peanut butter flavor, Red dye, Cherry flavor, and Zofran [ondansetron]    Review of Systems   Review of Systems  Gastrointestinal:  Positive for diarrhea, nausea and  vomiting. Negative for abdominal pain.    Physical Exam Updated Vital Signs BP 127/87   Pulse (!) 116   Temp 98.9 F (37.2 C) (Oral)   Resp 18   Ht 5\' 4"  (1.626 m)   Wt 107.5 kg   LMP 08/03/2022 (Exact Date)   SpO2 99%   Breastfeeding Unknown   BMI 40.68 kg/m  Physical Exam Vitals and nursing note reviewed.  Constitutional:      General: She is not in acute distress.    Appearance: She is well-developed.  HENT:     Head: Normocephalic and atraumatic.  Eyes:     Conjunctiva/sclera: Conjunctivae normal.  Cardiovascular:     Rate and Rhythm: Normal rate and regular rhythm.     Heart sounds: No murmur heard. Pulmonary:     Effort: Pulmonary effort is normal. No respiratory distress.     Breath sounds: Normal breath sounds.  Abdominal:     Palpations: Abdomen is soft.     Tenderness: There is no abdominal tenderness.  Musculoskeletal:        General: No swelling.     Cervical back: Neck supple.  Skin:    General: Skin is warm and dry.     Capillary Refill: Capillary refill takes less than 2 seconds.  Neurological:     Mental Status: She is alert.  Psychiatric:        Mood and  Affect: Mood normal.     ED Results / Procedures / Treatments   Labs (all labs ordered are listed, but only abnormal results are displayed) Labs Reviewed  RESP PANEL BY RT-PCR (RSV, FLU A&B, COVID)  RVPGX2 - Abnormal; Notable for the following components:      Result Value   SARS Coronavirus 2 by RT PCR POSITIVE (*)    All other components within normal limits  URINALYSIS, ROUTINE W REFLEX MICROSCOPIC - Abnormal; Notable for the following components:   APPearance HAZY (*)    Hgb urine dipstick TRACE (*)    Protein, ur 30 (*)    All other components within normal limits  URINALYSIS, MICROSCOPIC (REFLEX) - Abnormal; Notable for the following components:   Bacteria, UA MANY (*)    All other components within normal limits  PREGNANCY, URINE    EKG None  Radiology No results  found.  Procedures Procedures    Medications Ordered in ED Medications  sodium chloride 0.9 % bolus 1,000 mL (1,000 mLs Intravenous New Bag/Given 08/14/22 1032)  promethazine (PHENERGAN) tablet 12.5 mg (12.5 mg Oral Given 08/14/22 1017)    ED Course/ Medical Decision Making/ A&P  :1}                          Medical Decision Making Patient is 27 year old female, here for nausea, vomiting, diarrhea, and fever.  We will obtain a COVID/flu check a urinalysis, and a pregnancy test for further evaluation.  We also give her fluids, and Zofran to help with symptomatic control.  Amount and/or Complexity of Data Reviewed Labs: ordered.    Details: Urinalysis positive for bacteria, no leukocytes however.  There is contamination.  COVID-19 positive Discussion of management or test interpretation with external provider(s): Discussed with patient, she has no urinary symptoms, that she does have bacteria in her urine, however there is contamination, no evidence of leukocytes or white blood cells.  We will not treat at this time, culture sent for reflux.  She is okay with this.  Symptoms likely secondary to COVID-19, we discussed isolation protocol, and need for follow-up, return precautions.  Voiced understanding.  Phenergan sent to the pharmacy.  Risk Prescription drug management.    Final Clinical Impression(s) / ED Diagnoses Final diagnoses:  Nausea vomiting and diarrhea  COVID-19    Rx / DC Orders ED Discharge Orders          Ordered    promethazine (PHENERGAN) 12.5 MG tablet  Every 6 hours PRN        08/14/22 1106              Alonie Gazzola, [place], [place] 08/14/22 1108    Elgie Congo, MD 08/14/22 1829

## 2022-08-14 NOTE — Discharge Instructions (Addendum)
Please return to the ER if you are unable to tolerate p.o. intake, have intractable nausea, vomiting, or have oxygen saturations are less than 90%.  Make sure you are drinking lots of fluids, and isolating for at least 5 days, to prevent the spread of the illness.  Eat bland foods to help with your vomiting.

## 2022-12-13 ENCOUNTER — Emergency Department (HOSPITAL_BASED_OUTPATIENT_CLINIC_OR_DEPARTMENT_OTHER)
Admission: EM | Admit: 2022-12-13 | Discharge: 2022-12-13 | Disposition: A | Discharge status: Home / Self Care | Payer: Medicaid Other | Attending: Emergency Medicine | Admitting: Emergency Medicine

## 2022-12-13 ENCOUNTER — Encounter (HOSPITAL_BASED_OUTPATIENT_CLINIC_OR_DEPARTMENT_OTHER): Payer: Self-pay | Admitting: Emergency Medicine

## 2022-12-13 ENCOUNTER — Other Ambulatory Visit: Payer: Self-pay

## 2022-12-13 DIAGNOSIS — Z91010 Allergy to peanuts: Secondary | ICD-10-CM | POA: Insufficient documentation

## 2022-12-13 DIAGNOSIS — K0889 Other specified disorders of teeth and supporting structures: Secondary | ICD-10-CM | POA: Diagnosis not present

## 2022-12-13 MED ORDER — CLINDAMYCIN HCL 300 MG PO CAPS: 300.0000 mg | ORAL_CAPSULE | Freq: Three times a day (TID) | ORAL | 0 refills | Status: AC

## 2022-12-13 MED ORDER — IBUPROFEN 800 MG PO TABS: 800.0000 mg | ORAL_TABLET | Freq: Three times a day (TID) | ORAL | 0 refills | Status: AC | PRN

## 2022-12-13 NOTE — ED Provider Notes (Signed)
[place] [facility] Provider Note   CSN: [id number] Arrival date & time: 12/13/22  1114     History  Chief Complaint  Patient presents with   Dental Pain    Wanda Boone is a 27 y.o. female.  Complains of a tooth ache.  Patient reports she is unable to see the dentist until August.  Patient reports she has had increased pain for the past week patient denies any fever or chills she has not had any swelling and patient has pain in left upper gum  The history is provided by the patient. No language interpreter was used.  Dental Pain Location:  Upper Quality:  Aching Severity:  Moderate Onset quality:  Gradual Duration:  1 week Timing:  Constant Chronicity:  New Relieved by:  Nothing Worsened by:  Nothing      Home Medications Prior to Admission medications   Medication Sig Start Date End Date Taking? Authorizing Provider  clindamycin (CLEOCIN) 300 MG capsule Take 1 capsule (300 mg total) by mouth 3 (three) times daily for 10 days. 12/13/22 12/23/22 Yes Elson Areas, PA-C  ibuprofen (ADVIL) 800 MG tablet Take 1 tablet (800 mg total) by mouth every 8 (eight) hours as needed. 12/13/22  Yes Cheron Schaumann K, PA-C  amoxicillin (AMOXIL) 500 MG capsule Take 1 capsule (500 mg total) by mouth 3 (three) times daily. 09/08/20   Molpus, John, MD  Doxylamine-Pyridoxine 10-10 MG TBEC Take 1 tablet by mouth every 8 (eight) hours as needed. 03/24/22   Long, Arlyss Repress, MD  fluconazole (DIFLUCAN) 150 MG tablet Take 1 tablet if needed for vaginal yeast infection.  May repeat in 3 days if symptoms persist. 09/08/20   Molpus, Jonny Ruiz, MD  HYDROcodone-acetaminophen (NORCO) 5-325 MG tablet Take 1 tablet by mouth every 6 (six) hours as needed (for pain). 09/08/20   Molpus, Jonny Ruiz, MD  promethazine (PHENERGAN) 12.5 MG tablet Take 1 tablet (12.5 mg total) by mouth every 6 (six) hours as needed for nausea or vomiting. 08/14/22   Small, Brooke L, PA  dicyclomine (BENTYL) 20  MG tablet Take 1 tablet (20 mg total) by mouth 2 (two) times daily. 01/03/18 09/08/20  Aviva Kluver B, PA-C      Allergies    Peanut butter flavor, Red dye, Cherry flavor, and Zofran [ondansetron]    Review of Systems   Review of Systems  All other systems reviewed and are negative.   Physical Exam Updated Vital Signs BP 110/87   Pulse 97   Temp 98 F (36.7 C)   Resp 17   Ht 5\' 4"  (1.626 m)   Wt 106.6 kg   LMP 11/22/2022   SpO2 99%   BMI 40.34 kg/m  Physical Exam Vitals and nursing note reviewed.  Constitutional:      Appearance: She is well-developed.  HENT:     Head: Normocephalic.     Mouth/Throat:     Comments: Broken tooth Cardiovascular:     Rate and Rhythm: Normal rate.  Pulmonary:     Effort: Pulmonary effort is normal.  Abdominal:     General: There is no distension.  Musculoskeletal:        General: Normal range of motion.  Skin:    General: Skin is warm.  Neurological:     General: No focal deficit present.     Mental Status: She is alert and oriented to person, place, and time.  Psychiatric:        Mood and Affect:  Mood normal.     ED Results / Procedures / Treatments   Labs (all labs ordered are listed, but only abnormal results are displayed) Labs Reviewed - No data to display  EKG None  Radiology No results found.  Procedures Procedures    Medications Ordered in ED Medications - No data to display  ED Course/ Medical Decision Making/ A&P                             Medical Decision Making Patient complains of a tooth ache.  She is unable to see the dentist until August  Amount and/or Complexity of Data Reviewed Discussion of management or test interpretation with external provider(s): Primary care notes reviewed  Risk Prescription drug management. Risk Details: Given a prescription for clindamycin and ibuprofen.  Patient is given the phone number for Dr. Norma Fredrickson list dentist on-call.  She is advised to return if any  problems           Final Clinical Impression(s) / ED Diagnoses Final diagnoses:  Pain, dental    Rx / DC Orders ED Discharge Orders          Ordered    clindamycin (CLEOCIN) 300 MG capsule  3 times daily        12/13/22 1404    ibuprofen (ADVIL) 800 MG tablet  Every 8 hours PRN        12/13/22 1404           An After Visit Summary was printed and given to the patient.    Elson Areas, [place] 12/13/22 1603    Arby Barrette, MD 12/13/22 [phone]

## 2022-12-13 NOTE — ED Triage Notes (Signed)
Patient arrives ambulatory by POV c/o left upper dental pain going on for a while but worse over past few days. Has appt with dentist in August.

## 2022-12-13 NOTE — ED Notes (Signed)
Reviewed discharge instructions and medications with pt. Pt states understanding. Ambulatory at discharge 

## 2023-03-07 ENCOUNTER — Other Ambulatory Visit: Payer: Self-pay

## 2023-03-07 ENCOUNTER — Encounter (HOSPITAL_BASED_OUTPATIENT_CLINIC_OR_DEPARTMENT_OTHER): Payer: Self-pay

## 2023-03-07 DIAGNOSIS — K0889 Other specified disorders of teeth and supporting structures: Secondary | ICD-10-CM | POA: Insufficient documentation

## 2023-03-07 DIAGNOSIS — Z91010 Allergy to peanuts: Secondary | ICD-10-CM | POA: Diagnosis not present

## 2023-03-07 MED ORDER — IBUPROFEN 800 MG PO TABS
800.0000 mg | ORAL_TABLET | Freq: Once | ORAL | Status: AC
  Administered 2023-03-07: 800 mg via ORAL
  Filled 2023-03-07: qty 1

## 2023-03-07 NOTE — ED Triage Notes (Signed)
Pt c/o Rt sided upper and lower dental pain, states it started at 3pm

## 2023-03-08 ENCOUNTER — Emergency Department (HOSPITAL_BASED_OUTPATIENT_CLINIC_OR_DEPARTMENT_OTHER)
Admission: EM | Admit: 2023-03-08 | Discharge: 2023-03-08 | Disposition: A | Discharge status: Home / Self Care | Payer: Medicaid Other | Attending: Emergency Medicine | Admitting: Emergency Medicine

## 2023-03-08 DIAGNOSIS — K0889 Other specified disorders of teeth and supporting structures: Secondary | ICD-10-CM

## 2023-03-08 MED ORDER — HYDROCODONE-ACETAMINOPHEN 5-325 MG PO TABS: 1.0000 | ORAL_TABLET | Freq: Four times a day (QID) | ORAL | 0 refills | Status: AC | PRN

## 2023-03-08 NOTE — ED Provider Notes (Signed)
[place] [facility] Provider Note   CSN: [id number] Arrival date & time: 03/07/23  2329     History  Chief Complaint  Patient presents with   Dental Pain    Wanda Boone is a 27 y.o. female.  Patient is a 27 year old female presenting with complaints of dental pain.  She describes pain to her right lower rear molar that has been worsening for the past several weeks.  She began taking clindamycin several days ago, but this does not seem to be helping.  No difficulty breathing or swallowing.  No facial swelling.  The history is provided by the patient.       Home Medications Prior to Admission medications   Medication Sig Start Date End Date Taking? Authorizing Provider  amoxicillin (AMOXIL) 500 MG capsule Take 1 capsule (500 mg total) by mouth 3 (three) times daily. 09/08/20   Molpus, John, MD  Doxylamine-Pyridoxine 10-10 MG TBEC Take 1 tablet by mouth every 8 (eight) hours as needed. 03/24/22   Long, Arlyss Repress, MD  fluconazole (DIFLUCAN) 150 MG tablet Take 1 tablet if needed for vaginal yeast infection.  May repeat in 3 days if symptoms persist. 09/08/20   Molpus, Jonny Ruiz, MD  HYDROcodone-acetaminophen (NORCO) 5-325 MG tablet Take 1 tablet by mouth every 6 (six) hours as needed (for pain). 09/08/20   Molpus, John, MD  ibuprofen (ADVIL) 800 MG tablet Take 1 tablet (800 mg total) by mouth every 8 (eight) hours as needed. 12/13/22   Elson Areas, PA-C  promethazine (PHENERGAN) 12.5 MG tablet Take 1 tablet (12.5 mg total) by mouth every 6 (six) hours as needed for nausea or vomiting. 08/14/22   Small, Brooke L, PA  dicyclomine (BENTYL) 20 MG tablet Take 1 tablet (20 mg total) by mouth 2 (two) times daily. 01/03/18 09/08/20  Aviva Kluver B, PA-C      Allergies    Peanut butter flavor, Red dye #40 (allura red), Cherry flavor, and Zofran [ondansetron]    Review of Systems   Review of Systems  All other systems reviewed and are  negative.   Physical Exam Updated Vital Signs BP 119/81 (BP Location: Left Arm)   Pulse 79   Temp 98.8 F (37.1 C)   Resp 20   Ht 5\' 4"  (1.626 m)   Wt 103.4 kg   LMP 02/12/2023 (Exact Date)   SpO2 100%   BMI 39.14 kg/m  Physical Exam Vitals and nursing note reviewed.  HENT:     Mouth/Throat:     Comments: The left lower molar appears to have surrounding gingival inflammation, but no abscess. Pulmonary:     Effort: Pulmonary effort is normal.  Skin:    General: Skin is warm and dry.  Neurological:     Mental Status: She is alert.     ED Results / Procedures / Treatments   Labs (all labs ordered are listed, but only abnormal results are displayed) Labs Reviewed - No data to display  EKG None  Radiology No results found.  Procedures Procedures    Medications Ordered in ED Medications  ibuprofen (ADVIL) tablet 800 mg (800 mg Oral Given 03/07/23 2338)    ED Course/ Medical Decision Making/ A&P  Dental pain.  Patient already taking clindamycin, so will prescribe pain medication and have her follow-up with dentistry as scheduled.  Final Clinical Impression(s) / ED Diagnoses Final diagnoses:  None    Rx / DC Orders ED Discharge Orders     None  Geoffery Lyons, MD 03/08/23 [phone]

## 2023-03-08 NOTE — Discharge Instructions (Signed)
Begin taking ibuprofen 600 mg every 6 hours as needed for pain.  Begin taking hydrocodone as prescribed as needed for pain not relieved with ibuprofen.  Follow-up with your dentist in the next few days.

## 2023-03-22 ENCOUNTER — Emergency Department (HOSPITAL_BASED_OUTPATIENT_CLINIC_OR_DEPARTMENT_OTHER)
Admission: EM | Admit: 2023-03-22 | Discharge: 2023-03-22 | Disposition: A | Discharge status: Home / Self Care | Payer: Medicaid Other | Attending: Emergency Medicine | Admitting: Emergency Medicine

## 2023-03-22 ENCOUNTER — Encounter (HOSPITAL_BASED_OUTPATIENT_CLINIC_OR_DEPARTMENT_OTHER): Payer: Self-pay | Admitting: Emergency Medicine

## 2023-03-22 ENCOUNTER — Other Ambulatory Visit: Payer: Self-pay

## 2023-03-22 DIAGNOSIS — Z1152 Encounter for screening for COVID-19: Secondary | ICD-10-CM | POA: Diagnosis not present

## 2023-03-22 DIAGNOSIS — J020 Streptococcal pharyngitis: Secondary | ICD-10-CM | POA: Diagnosis not present

## 2023-03-22 DIAGNOSIS — J029 Acute pharyngitis, unspecified: Secondary | ICD-10-CM | POA: Diagnosis present

## 2023-03-22 LAB — SARS CORONAVIRUS 2 BY RT PCR: SARS Coronavirus 2 by RT PCR: NEGATIVE

## 2023-03-22 LAB — GROUP A STREP BY PCR: Group A Strep by PCR: DETECTED — AB

## 2023-03-22 MED ORDER — LIDOCAINE VISCOUS HCL 2 % MT SOLN
15.0000 mL | Freq: Once | OROMUCOSAL | Status: AC
  Administered 2023-03-22: 15 mL via OROMUCOSAL
  Filled 2023-03-22: qty 15

## 2023-03-22 MED ORDER — PENICILLIN G BENZATHINE 1200000 UNIT/2ML IM SUSY
2.4000 10*6.[IU] | PREFILLED_SYRINGE | Freq: Once | INTRAMUSCULAR | Status: AC
  Administered 2023-03-22: 2.4 10*6.[IU] via INTRAMUSCULAR
  Filled 2023-03-22: qty 4

## 2023-03-22 MED ORDER — DEXAMETHASONE 10 MG/ML FOR PEDIATRIC ORAL USE
10.0000 mg | Freq: Once | INTRAMUSCULAR | Status: AC
  Administered 2023-03-22: 10 mg via ORAL
  Filled 2023-03-22: qty 1

## 2023-03-22 MED ORDER — KETOROLAC TROMETHAMINE 60 MG/2ML IM SOLN
60.0000 mg | Freq: Once | INTRAMUSCULAR | Status: AC
  Administered 2023-03-22: 60 mg via INTRAMUSCULAR
  Filled 2023-03-22: qty 2

## 2023-03-22 NOTE — ED Triage Notes (Signed)
Sore throat x 2 days.  No fever.  Some congestion.

## 2023-03-22 NOTE — ED Provider Notes (Signed)
[place] [facility] Provider Note   CSN: [id number] Arrival date & time: 03/22/23  9562     History  Chief Complaint  Patient presents with   Sore Throat    Wanda Boone is a 27 y.o. female.  HPI      26yo female with no significant medical history presents with concern for sore throat.  Has had sore throat for 2 days, initially left side of throat and now both. Pain with swallowing. Voice is hoarse. No shortness of breath, cough, fever, nausea. Vomited once but reports because of chronic post-nasal gtt and trying to cough that up and ended up vomiting.  Has had some dental problems which are improved. No known sick contacts. Does not think she is pregnant.  Home Medications Prior to Admission medications   Medication Sig Start Date End Date Taking? Authorizing Provider  amoxicillin (AMOXIL) 500 MG capsule Take 1 capsule (500 mg total) by mouth 3 (three) times daily. 09/08/20   Molpus, John, MD  Doxylamine-Pyridoxine 10-10 MG TBEC Take 1 tablet by mouth every 8 (eight) hours as needed. 03/24/22   Long, Arlyss Repress, MD  fluconazole (DIFLUCAN) 150 MG tablet Take 1 tablet if needed for vaginal yeast infection.  May repeat in 3 days if symptoms persist. 09/08/20   Molpus, Jonny Ruiz, MD  HYDROcodone-acetaminophen (NORCO) 5-325 MG tablet Take 1-2 tablets by mouth every 6 (six) hours as needed. 03/08/23   Geoffery Lyons, MD  ibuprofen (ADVIL) 800 MG tablet Take 1 tablet (800 mg total) by mouth every 8 (eight) hours as needed. 12/13/22   Elson Areas, PA-C  promethazine (PHENERGAN) 12.5 MG tablet Take 1 tablet (12.5 mg total) by mouth every 6 (six) hours as needed for nausea or vomiting. 08/14/22   Small, Brooke L, PA  dicyclomine (BENTYL) 20 MG tablet Take 1 tablet (20 mg total) by mouth 2 (two) times daily. 01/03/18 09/08/20  Aviva Kluver B, PA-C      Allergies    Peanut butter flavor, Red dye #40 (allura red), Cherry flavor, and Zofran [ondansetron]     Review of Systems   Review of Systems  Physical Exam Updated Vital Signs BP 110/72 (BP Location: Left Arm)   Pulse 100   Temp 98.7 F (37.1 C) (Oral)   Resp 18   Ht 5\' 4"  (1.626 m)   Wt 103.4 kg   LMP 03/09/2023 (Exact Date)   SpO2 100%   BMI 39.14 kg/m  Physical Exam Vitals and nursing note reviewed.  Constitutional:      General: She is not in acute distress.    Appearance: Normal appearance. She is not ill-appearing, toxic-appearing or diaphoretic.  HENT:     Head: Normocephalic.     Mouth/Throat:     Pharynx: Oropharyngeal exudate present. No pharyngeal swelling or uvula swelling.  Eyes:     Conjunctiva/sclera: Conjunctivae normal.  Cardiovascular:     Rate and Rhythm: Normal rate and regular rhythm.     Pulses: Normal pulses.  Pulmonary:     Effort: Pulmonary effort is normal. No respiratory distress.     Breath sounds: No stridor. No wheezing, rhonchi or rales.  Musculoskeletal:        General: No deformity or signs of injury.     Cervical back: No rigidity.  Skin:    General: Skin is warm and dry.     Coloration: Skin is not jaundiced or pale.  Neurological:     General: No focal deficit present.  Mental Status: She is alert and oriented to person, place, and time.     ED Results / Procedures / Treatments   Labs (all labs ordered are listed, but only abnormal results are displayed) Labs Reviewed  GROUP A STREP BY PCR - Abnormal; Notable for the following components:      Result Value   Group A Strep by PCR DETECTED (*)    All other components within normal limits  SARS CORONAVIRUS 2 BY RT PCR    EKG None  Radiology No results found.  Procedures Procedures    Medications Ordered in ED Medications  dexamethasone (DECADRON) 10 MG/ML injection for Pediatric ORAL use 10 mg (10 mg Oral Given 03/22/23 0808)  lidocaine (XYLOCAINE) 2 % viscous mouth solution 15 mL (15 mLs Mouth/Throat Given 03/22/23 0807)  ketorolac (TORADOL) injection 60 mg (60  mg Intramuscular Given 03/22/23 0809)  penicillin g benzathine (BICILLIN LA) 1200000 UNIT/2ML injection 2.4 Million Units (2.4 Million Units Intramuscular Given 03/22/23 0911)    ED Course/ Medical Decision Making/ A&P                                  26yo female with no significant medical history presents with concern for sore throat.  No sign of PTA/RPA/epiglottitis.  No sign of pneumonia.  Has tonsilar exudate, sent strep/COVID testing. Given decadron, toradol, lidocaine for symptom relief. Declines pregnancy testing.   COVID testing negative. Strep testing positive.  Given options for treatment, would like penicillin IM for treatment of strep throat.  Patient discharged in stable condition with understanding of reasons to return.         Final Clinical Impression(s) / ED Diagnoses Final diagnoses:  Strep pharyngitis    Rx / DC Orders ED Discharge Orders     None         Alvira Monday, MD 03/24/23 [phone]

## 2023-04-20 ENCOUNTER — Other Ambulatory Visit: Payer: Self-pay

## 2023-04-20 ENCOUNTER — Emergency Department (HOSPITAL_BASED_OUTPATIENT_CLINIC_OR_DEPARTMENT_OTHER): Payer: Medicaid Other

## 2023-04-20 ENCOUNTER — Emergency Department (HOSPITAL_BASED_OUTPATIENT_CLINIC_OR_DEPARTMENT_OTHER)
Admission: EM | Admit: 2023-04-20 | Discharge: 2023-04-20 | Disposition: A | Discharge status: Home / Self Care | Payer: Medicaid Other | Attending: Emergency Medicine | Admitting: Emergency Medicine

## 2023-04-20 ENCOUNTER — Encounter (HOSPITAL_BASED_OUTPATIENT_CLINIC_OR_DEPARTMENT_OTHER): Payer: Self-pay | Admitting: Emergency Medicine

## 2023-04-20 DIAGNOSIS — O210 Mild hyperemesis gravidarum: Secondary | ICD-10-CM

## 2023-04-20 DIAGNOSIS — Z91010 Allergy to peanuts: Secondary | ICD-10-CM | POA: Diagnosis not present

## 2023-04-20 DIAGNOSIS — O211 Hyperemesis gravidarum with metabolic disturbance: Secondary | ICD-10-CM | POA: Insufficient documentation

## 2023-04-20 DIAGNOSIS — E860 Dehydration: Secondary | ICD-10-CM | POA: Diagnosis not present

## 2023-04-20 DIAGNOSIS — R1110 Vomiting, unspecified: Secondary | ICD-10-CM | POA: Diagnosis present

## 2023-04-20 LAB — COMPREHENSIVE METABOLIC PANEL
ALT: 18 U/L (ref 0–44)
AST: 17 U/L (ref 15–41)
Albumin: 3.9 g/dL (ref 3.5–5.0)
Alkaline Phosphatase: 46 U/L (ref 38–126)
Anion gap: 10 (ref 5–15)
BUN: 8 mg/dL (ref 6–20)
CO2: 19 mmol/L — ABNORMAL LOW (ref 22–32)
Calcium: 8.8 mg/dL — ABNORMAL LOW (ref 8.9–10.3)
Chloride: 105 mmol/L (ref 98–111)
Creatinine, Ser: 0.66 mg/dL (ref 0.44–1.00)
GFR, Estimated: >60 mL/min (ref >60)
Glucose, Bld: 116 mg/dL — ABNORMAL HIGH (ref 70–99)
Potassium: 3.3 mmol/L — ABNORMAL LOW (ref 3.5–5.1)
Sodium: 134 mmol/L — ABNORMAL LOW (ref 135–145)
Total Bilirubin: 0.8 mg/dL (ref 0.3–1.2)
Total Protein: 7.3 g/dL (ref 6.5–8.1)

## 2023-04-20 LAB — CBC WITH DIFFERENTIAL/PLATELET
Abs Immature Granulocytes: 0.02 10*3/uL (ref 0.00–0.07)
Basophils Absolute: 0 10*3/uL (ref 0.0–0.1)
Basophils Relative: 0 %
Eosinophils Absolute: 0.1 10*3/uL (ref 0.0–0.5)
Eosinophils Relative: 1 %
HCT: 38.2 % (ref 36.0–46.0)
Hemoglobin: 13.4 g/dL (ref 12.0–15.0)
Immature Granulocytes: 0 %
Lymphocytes Relative: 44 %
Lymphs Abs: 2.9 10*3/uL (ref 0.7–4.0)
MCH: 32.6 pg (ref 26.0–34.0)
MCHC: 35.1 g/dL (ref 30.0–36.0)
MCV: 92.9 fL (ref 80.0–100.0)
Monocytes Absolute: 0.5 10*3/uL (ref 0.1–1.0)
Monocytes Relative: 8 %
Neutro Abs: 3 10*3/uL (ref 1.7–7.7)
Neutrophils Relative %: 47 %
Platelets: 189 10*3/uL (ref 150–400)
RBC: 4.11 MIL/uL (ref 3.87–5.11)
RDW: 12.3 % (ref 11.5–15.5)
WBC: 6.5 10*3/uL (ref 4.0–10.5)
nRBC: 0 % (ref 0.0–0.2)

## 2023-04-20 LAB — LIPASE, BLOOD: Lipase: 23 U/L (ref 11–51)

## 2023-04-20 LAB — HCG, QUANTITATIVE, PREGNANCY: hCG, Beta Chain, Quant, S: 29244 m[IU]/mL — ABNORMAL HIGH (ref <5)

## 2023-04-20 LAB — URINALYSIS, W/ REFLEX TO CULTURE (INFECTION SUSPECTED)
Bilirubin Urine: NEGATIVE
Glucose, UA: NEGATIVE mg/dL
Ketones, ur: 80 mg/dL — AB
Nitrite: NEGATIVE
Protein, ur: NEGATIVE mg/dL
Specific Gravity, Urine: 1.025 (ref 1.005–1.030)
pH: 7 (ref 5.0–8.0)

## 2023-04-20 MED ORDER — LACTATED RINGERS IV BOLUS
1000.0000 mL | Freq: Once | INTRAVENOUS | Status: AC
  Administered 2023-04-20: 1000 mL via INTRAVENOUS

## 2023-04-20 MED ORDER — METOCLOPRAMIDE HCL 5 MG PO TABS: 5.0000 mg | ORAL_TABLET | Freq: Four times a day (QID) | ORAL | 0 refills | Status: AC

## 2023-04-20 MED ORDER — POTASSIUM CHLORIDE CRYS ER 20 MEQ PO TBCR
40.0000 meq | EXTENDED_RELEASE_TABLET | Freq: Once | ORAL | Status: AC
  Administered 2023-04-20: 40 meq via ORAL
  Filled 2023-04-20: qty 2

## 2023-04-20 MED ORDER — METOCLOPRAMIDE HCL 5 MG/ML IJ SOLN: 5.0000 mg | Freq: Once | INTRAMUSCULAR | Status: DC

## 2023-04-20 MED ORDER — METOCLOPRAMIDE HCL 10 MG PO TABS
5.0000 mg | ORAL_TABLET | Freq: Once | ORAL | Status: AC
  Administered 2023-04-20: 5 mg via ORAL
  Filled 2023-04-20: qty 1

## 2023-04-20 NOTE — ED Triage Notes (Signed)
Pt to ER with c/o unable to keep "anything" down.  States she has not been able to tolerate PO for last 2 days.  States she is lightheaded and becomes dizzy when she stand.  States she is having hot sweats and SHOB.  Pt found out several weeks ago that she is pregnant - LMP 03/09/23.  Denies abd pain, bleeding or vaginal d/c.

## 2023-04-20 NOTE — ED Provider Notes (Signed)
[place] [facility] Provider Note   CSN: [id number] Arrival date & time: 04/20/23  0725     History  Chief Complaint  Patient presents with   Emesis   Dizziness    Wanda Boone is a 27 y.o. female.  26 y.o. female presenting with 2 to 3 days of nausea, vomiting, lightheadedness/dizziness.  She is newly pregnant [redacted] weeks along.  This is her fourth pregnancy.  She denies having prior history of hyperemesis gravidarum.   She denies smoking marijuana or tobacco recently.  She reports she has been unable to tolerate food or liquids last 2 days.  Denies hematemesis and diarrhea.  Denies sick contacts.  She reports she has establish care with a new OB outpatient for this pregnancy.  She has not tried anything at home to help with nausea or vomiting.  The history is provided by the patient.  Emesis Associated symptoms: no chills, no diarrhea, no fever, no headaches and no sore throat   Dizziness Associated symptoms: nausea and vomiting   Associated symptoms: no blood in stool, no chest pain, no diarrhea, no headaches and no shortness of breath        Home Medications Prior to Admission medications   Medication Sig Start Date End Date Taking? Authorizing Provider  metoCLOPramide (REGLAN) 5 MG tablet Take 1 tablet (5 mg total) by mouth 4 (four) times daily for 10 doses. 04/20/23 04/23/23 Yes Glendale Chard, DO  amoxicillin (AMOXIL) 500 MG capsule Take 1 capsule (500 mg total) by mouth 3 (three) times daily. 09/08/20   Molpus, John, MD  Doxylamine-Pyridoxine 10-10 MG TBEC Take 1 tablet by mouth every 8 (eight) hours as needed. 03/24/22   Long, Arlyss Repress, MD  fluconazole (DIFLUCAN) 150 MG tablet Take 1 tablet if needed for vaginal yeast infection.  May repeat in 3 days if symptoms persist. 09/08/20   Molpus, Jonny Ruiz, MD  HYDROcodone-acetaminophen (NORCO) 5-325 MG tablet Take 1-2 tablets by mouth every 6 (six) hours as needed. 03/08/23   Geoffery Lyons, MD   ibuprofen (ADVIL) 800 MG tablet Take 1 tablet (800 mg total) by mouth every 8 (eight) hours as needed. 12/13/22   Elson Areas, PA-C  promethazine (PHENERGAN) 12.5 MG tablet Take 1 tablet (12.5 mg total) by mouth every 6 (six) hours as needed for nausea or vomiting. 08/14/22   Small, Brooke L, PA  dicyclomine (BENTYL) 20 MG tablet Take 1 tablet (20 mg total) by mouth 2 (two) times daily. 01/03/18 09/08/20  Aviva Kluver B, PA-C      Allergies    Peanut butter flavor, Red dye #40 (allura red), Cherry flavor, and Zofran [ondansetron]    Review of Systems   Review of Systems  Constitutional:  Positive for appetite change. Negative for activity change, chills and fever.  HENT:  Negative for congestion, rhinorrhea, sinus pressure, sinus pain and sore throat.   Respiratory:  Negative for apnea and shortness of breath.   Cardiovascular:  Negative for chest pain.  Gastrointestinal:  Positive for nausea and vomiting. Negative for abdominal distention, blood in stool, constipation and diarrhea.  Genitourinary:  Negative for difficulty urinating, frequency, urgency, vaginal bleeding and vaginal discharge.  Skin:  Negative for color change.  Neurological:  Positive for dizziness and light-headedness. Negative for seizures, syncope and headaches.    Physical Exam Updated Vital Signs BP 116/62   Pulse 73   Temp 98.9 F (37.2 C) (Oral)   Resp 17   Ht 5\' 4"  (1.626  m)   Wt 108 kg   LMP 03/09/2023 (Exact Date)   SpO2 100%   BMI 40.85 kg/m  Physical Exam Constitutional:      General: She is not in acute distress.    Appearance: Normal appearance. She is obese. She is not ill-appearing or toxic-appearing.  HENT:     Head: Normocephalic.     Mouth/Throat:     Mouth: Mucous membranes are moist.  Eyes:     Extraocular Movements: Extraocular movements intact.     Conjunctiva/sclera: Conjunctivae normal.     Pupils: Pupils are equal, round, and reactive to light.  Cardiovascular:     Rate and  Rhythm: Normal rate and regular rhythm.     Pulses: Normal pulses.     Heart sounds: Normal heart sounds.  Pulmonary:     Effort: Pulmonary effort is normal.     Breath sounds: Normal breath sounds.  Abdominal:     General: Abdomen is flat. Bowel sounds are normal. There is no distension.     Palpations: Abdomen is soft.     Tenderness: There is no abdominal tenderness. There is no guarding.  Musculoskeletal:        General: Normal range of motion.     Cervical back: Normal range of motion.  Skin:    General: Skin is warm.     Capillary Refill: Capillary refill takes less than 2 seconds.  Neurological:     General: No focal deficit present.     Mental Status: She is alert and oriented to person, place, and time. Mental status is at baseline.  Psychiatric:        Mood and Affect: Mood normal.        Behavior: Behavior normal.        Thought Content: Thought content normal.        Judgment: Judgment normal.     ED Results / Procedures / Treatments   Labs (all labs ordered are listed, but only abnormal results are displayed) Labs Reviewed  COMPREHENSIVE METABOLIC PANEL - Abnormal; Notable for the following components:      Result Value   Sodium 134 (*)    Potassium 3.3 (*)    CO2 19 (*)    Glucose, Bld 116 (*)    Calcium 8.8 (*)    All other components within normal limits  URINALYSIS, W/ REFLEX TO CULTURE (INFECTION SUSPECTED) - Abnormal; Notable for the following components:   APPearance CLOUDY (*)    Hgb urine dipstick SMALL (*)    Ketones, ur 80 (*)    Leukocytes,Ua TRACE (*)    Bacteria, UA FEW (*)    All other components within normal limits  HCG, QUANTITATIVE, PREGNANCY - Abnormal; Notable for the following components:   hCG, Beta Chain, Quant, S 29,244 (*)    All other components within normal limits  CBC WITH DIFFERENTIAL/PLATELET  LIPASE, BLOOD    EKG None  Radiology No results found.  Procedures Procedures    Medications Ordered in  ED Medications  lactated ringers bolus 1,000 mL (0 mLs Intravenous Stopped 04/20/23 1034)  metoCLOPramide (REGLAN) tablet 5 mg (5 mg Oral Given 04/20/23 0908)  potassium chloride SA (KLOR-CON M) CR tablet 40 mEq (40 mEq Oral Given 04/20/23 2956)    ED Course/ Medical Decision Making/ A&P                                 Medical  Decision Making 27 y.o. female presenting with 2 to 3 days of persistent nausea and vomiting with dizziness and lightheaded with standing.  On presentation vital signs are stable.  On exam she does not have abdominal pain or tenderness, capillary refill less than 2 seconds.  Differential for presentation includes hyperemesis gravidarum, marijuana induced hyperemesis, ectopic pregnancy, UTI/pyelonephritis, viral gastroenteritis.  Due to patient being afebrile and having a benign exam, less likely to be infectious at this time.  Patient was given 1 L fluid bolus with oral Reglan.  Pelvic ultrasound obtained and upon independent review showed gestational sac within the uterus with yolk sac present.  CMP significant for hypokalemia to 3.3 and normal lipase.  Potassium was repleted with 40 mEq K-Lor tablet.  UA resulted with no signs of acute infection.  On reassessment, patient was able to tolerate PO.  With intrauterine pregnancy confirmed via ultrasound low suspicion for ectopic pregnancy.  Most likely vomiting and nausea secondary to first trimester pregnancy.  After fluid bolus dizziness resolved.  Patient recommended to follow-up with PCP outpatient and have BMP rechecked for electrolytes.  Patient was given scription for Reglan and discussed to try Unisom with B6 for nausea first-line at home.  Patient to follow-up with OB outpatient for remainder prenatal care.  Amount and/or Complexity of Data Reviewed Labs: ordered. Radiology: ordered.  Risk Prescription drug management.          Final Clinical Impression(s) / ED Diagnoses Final diagnoses:  Dehydration   Hyperemesis gravidarum    Rx / DC Orders ED Discharge Orders          Ordered    metoCLOPramide (REGLAN) 5 MG tablet  4 times daily        04/20/23 1241              Glendale Chard, DO 04/20/23 1246    Alvira Monday, MD 04/20/23 2310

## 2023-04-20 NOTE — Discharge Instructions (Addendum)
First line for nausea and vomiting during pregnancy should be Unisom and Vitamin B6 tablets. You can find these over the counter and they are safe in pregnancy. If you continue to be nauseated you can take a Reglan tablet.   Please follow up with your OB outpatient for remainder of prenatal care.

## 2023-04-20 NOTE — ED Notes (Signed)
Pt assisted up to [place], to get urine  states is dizzy ,did drive herself here she states

## 2023-04-22 NOTE — ED Triage Notes (Addendum)
 Pt c/o SHOB, dizziness, n/v, decreased PO intake, upper abd pain x 4 days. Pt reports she is [redacted] weeks pregnant. Pt denies CP, fevers, chills, urinary sx, abd cramping, vaginal discharge.

## 2023-04-27 NOTE — Telephone Encounter (Signed)
 Last visit: 01/10/23 Last prescribed:01/10/23 Future appointment:No Last TB test: 02/23/22 Refill per protocol

## 2023-05-15 NOTE — Progress Notes (Signed)
 Care Management Case Closure Note  Situation: Member not enrolled in complex care management services.   Background: Member generated from [facility] report due to ED visit at [facility] 04-22-23 for Hyperemesis gravidarum Hyperemesis gravidarum   Assessment: Ambulatory Care Navigator made multiple attempts to contact the member  with no response or return call. Ambulatory Care Navigator will proceed with case closure per CM 14-day Unable to Reach (UTR) process.    Recommendation: If further needs arise, new referral can be sent for Care Management. CM Unable to Reach (UTR) letter sent with ACN's contact information.  Jonne Clara, RN, BSN, [facility] [place], [place] [address] Office [phone]

## 2023-05-15 NOTE — Progress Notes (Signed)
 Care Management Note:  Situation: Member not enrolled in care navigation services.   Background: Member generated from [facility] report due to ED visit at [facility] 04-22-23 for Hyperemesis gravidarum   Assessment: Ambulatory Care Navigator made attempt to contact member.  No answer.VM left with contact information requesting callback.  Recommendation: ACN will set a task to make another attempt to reach member.   Jonne Clara, RN, BSN, [facility] [place], [place] [address] Office [phone]

## 2023-05-26 NOTE — Progress Notes (Signed)
 [facility] 05/26/23 No primary care provider on file. Treatment Providers * No providers found *  Dental procedures in this visit  . I2789 - EXTRACTION, ERUPTED TOOTH REQUIRING REMOVAL OF BONE AND/OR SECTIONING OF TOOTH 4 (Completed)    Service provider: Hadassah Bathe, DMD    Billing provider: Hadassah Bathe, DMD    HEALTH HISTORY ? Vitals:  BP Readings from Last 1 Encounters:  05/26/23 111/76    Pulse:  81 Medical history was reviewed and updated. No contraindication to care. History reviewed. No pertinent past medical history. No past surgical history on file. Social History   Tobacco Use  . Smoking status: Not on file  . Smokeless tobacco: Not on file  Substance Use Topics  . Alcohol use: Not on file   No family history on file. Current Outpatient Medications on File Prior to Visit  Medication Sig Dispense Refill  . metoclopramide  (REGLAN ) 5 mg tablet Take 5 mg by mouth.    . dapsone 25 mg tablet Take 25 mg by mouth in the morning and at bedtime.    . diphenhydrAMINE  (BENADRYL ) 25 mg capsule Take one tablet every 4-6 hours as needed for nausea    . doxylamine -pyridoxine , vit B6, 10-10 mg tablet,delayed release (DR/EC) Take 1 tablet by mouth 1 (one) time each day.    . Humira,CF, Pen 40 mg/0.4 mL pen injector kit INJECT THE CONTENTS OF 1 PEN INTO THE SKIN ONCE EVERY WEEK    . HYDROcodone -acetaminophen  (NORCO) 5-325 mg tablet Take 1-2 tablets by mouth.    . hydrocortisone 2.5 % cream Apply to affected areas on the forehead daily    . ibuprofen  (ADVIL ,MOTRIN ) 800 mg tablet Take 800 mg by mouth every 8 (eight) hours if needed.    . silver sulfADIAZINE (SILVADENE, SSD) 1 % cream Apply to affected areaS once or twice daily    . spironolactone (ALDACTONE) 50 mg tablet Take 1 pill daily after breakfast and dinner x 3 months supply    . triamcinolone (KENALOG) 0.1 % ointment Apply to dry patches once a day. Never to the face    . valACYclovir (VALTREX) 500 mg tablet 1  tab po daily to prevent recurrences and if a lesion occurs take twice a day for 3 days     No current facility-administered medications on file prior to visit.   Medical Risk Assessment ASA GRADE: ASA 2 - Patient with mild systemic disease with no functional limitations  Subjective: Patient presents with  discomfort #4  Patient reports  The the temp filling fell out 2 days ago. 05/24/23.   Objective:  Radiographs taken: 4 PA's of area:  Paused to confirm correct patient, correct teeth for treatment, and confirmed diagnosis for treatment.  Assessment: Pt presents to clinic for surgical singular of tooth number(s): 4 due to non-restorable Discussed with patient risks and precautions of procedure. Necessary consent were obtained from patient.  RISKS & LIABILITY GIVEN: Discussed with patient potential risk of procedural complication which may include NA  Plan: Topical (Benzocaine 20%) placed.  ANESTHETIC 1:Lidocaine  2% epi: 1:100K x Carpules: 3 carpules;  ANESTHETIC 2:Mepivacaine 2%, Levonordefrine 1:20K x Carpules: 2 carpules;  Profound anesthesia achieved. Use of periosteal, luxators, and elevators to luxate tooth. Use of forcep to extract tooth. Curetted and Irrigation w/ sterile water and/or 0.12% chlohexidine gluconate of extraction site. Alveolar bone compression applied at site of extraction. Placed moistened 2x2 guaze at extraction site with instructions to remove in 20-30 minutes unless excessive bleeding noted.  Surgical foam/plug placed: NA Surgical procedure: NA Suture type: No Suture Placed No brisk bleeding observed. No observation of buccal or lingual plate fracture. Patient dismissed in stable condition. PRESCRIPTION: none Discussed with patient the importance of preserving the clot in socket by avoiding for a minimum of 72 hours: spitting, swishing, use of mouthwash, use of peroxide, use of straws, and smoking.  Reviewed post op instructions and answered all of patient's  questions. Patient was given home care instructions. F/u as needed, pt to call with questions or concerns. Advised pt, if after being dismissed pt notes excessive bleeding to contact our office immediately or, if unable to reach our office, report to urgent care/emergency room as needed.  Additional notes: None [place]: Dr. Appointment: Earnestine  Treatment Providers * No providers found * [facility] [address] [place] [address] [phone]

## 2023-06-02 NOTE — Progress Notes (Signed)
 [facility] 06/02/23 No primary care provider on file. Treatment Providers * No providers found *  Dental procedures in this visit  . I9828 - RE-EVALUATION - POST-OPERATIVE OFFICE VISIT  . D2392 - RESIN-BASED COMPOSITE - TWO SURFACES, POSTERIOR 5 DL  . I9779 - SINGLE X-RAY  . I0000 - PLAN VISIT FEE    HEALTH HISTORY ? Vitals:  BP Readings from Last 1 Encounters:  06/02/23 113/80    Pulse:  76 Medical history was reviewed and updated. No contraindication to care. No past medical history on file. No past surgical history on file. Social History   Tobacco Use  . Smoking status: Not on file  . Smokeless tobacco: Not on file  Substance Use Topics  . Alcohol use: Not on file   No family history on file. Current Outpatient Medications on File Prior to Visit  Medication Sig Dispense Refill  . dapsone 25 mg tablet Take 25 mg by mouth in the morning and at bedtime.    . diphenhydrAMINE  (BENADRYL ) 25 mg capsule Take one tablet every 4-6 hours as needed for nausea    . doxylamine -pyridoxine , vit B6, 10-10 mg tablet,delayed release (DR/EC) Take 1 tablet by mouth 1 (one) time each day.    . Humira,CF, Pen 40 mg/0.4 mL pen injector kit INJECT THE CONTENTS OF 1 PEN INTO THE SKIN ONCE EVERY WEEK    . HYDROcodone -acetaminophen  (NORCO) 5-325 mg tablet Take 1-2 tablets by mouth.    . hydrocortisone 2.5 % cream Apply to affected areas on the forehead daily    . ibuprofen  (ADVIL ,MOTRIN ) 800 mg tablet Take 800 mg by mouth every 8 (eight) hours if needed.    . metoclopramide  (REGLAN ) 5 mg tablet Take 5 mg by mouth.    . silver sulfADIAZINE (SILVADENE, SSD) 1 % cream Apply to affected areaS once or twice daily    . spironolactone (ALDACTONE) 50 mg tablet Take 1 pill daily after breakfast and dinner x 3 months supply    . triamcinolone (KENALOG) 0.1 % ointment Apply to dry patches once a day. Never to the face    . valACYclovir (VALTREX) 500 mg tablet 1 tab po daily to prevent  recurrences and if a lesion occurs take twice a day for 3 days     No current facility-administered medications on file prior to visit.   Medical Risk Assessment ASA GRADE: ASA 1 - Normal health patient  Subjective: Patient reports no change to condition since last visit. History of presenting condition: Tooth/teeth to be treated testing asymptomatic   Objective:  Radiographs taken: No additional radiographs captured at this appointment and 1 PA's of area: 5  Assessment: Procedure:  Composite/Resin Direct restorations of tooth/teeth:  #5 O D due to primary caries  Procedure risks, benefits, and concerns were explained to the patient.  A formal consent was signed after the risks were explained to the patient.  Plan: Paused to confirm correct patient, correct teeth for treatment, and confirmed diagnosis for treatment.  Topical anesthetic (benzocaine 20%) was applied  ANESTHETIC 1:Lidocaine  2% epi: 1:100K x Carpules: 1 carpule;  Procedure performed under cotton rolls isolation  [place] Bonding agent:  Scotch bond. Tooth restored to function using composite resin. Composite resin:  Filtek Bulk shade A2. Amalgam:  NA Contacts and occlusion checked and adjusted. Restoration polished using Finishing burs and Burnishing  Patient informed about possible short-term sensitivity to cold.  Provider should be contacted in the event of severe sensitivity or any sensitivity to heat and/or  pain on biting.  NV: Recall/maintenance and Dr. Appointment: 06/13/24 Advised pt for rct, pt wants to try to save as many teeth possible. Fill upper left quadrant until pt is able to get rct  Treatment Providers * No providers found * [facility] [address] [place] [address] [phone]

## 2023-06-05 NOTE — Telephone Encounter (Signed)
 TJB  : Pt called and stated that she had a tooth pulled a week ago , she  stated that she is still having pain from the extraction .   She is requesting Ibuprofen  to be called to [employer] at [facility] .   She was advised to contact the Dentist's Office ad advise them of her pain .   She stated the dentist does not provide pain medication .   I offered her an appt and she declined .   Pt then asked that she is called by a medical assistant at [phone] .

## 2023-06-05 NOTE — Telephone Encounter (Signed)
 Please advise in TBJ absence. Patient last seen by TBJ 11/2022 for CPE.

## 2023-06-06 NOTE — Telephone Encounter (Signed)
 Called patient - no answer, lvmtrc

## 2023-06-21 NOTE — Progress Notes (Signed)
 [facility] 06/21/23 No primary care provider on file. Treatment Providers * No providers found *  Dental procedures in this visit  . I9828 - RE-EVALUATION - POST-OPERATIVE OFFICE VISIT (Completed)    Service provider: Darl Seen, DDS    Billing provider: Darl Seen, DDS    HEALTH HISTORY ? Vitals:  BP Readings from Last 1 Encounters:  06/21/23 111/75    Pulse:  92 Medical history was reviewed and updated. No contraindication to care. Past Medical History:  Diagnosis Date  . Anxiety and depression 06/22/2016   No past surgical history on file. Social History   Tobacco Use  . Smoking status: Not on file  . Smokeless tobacco: Not on file  Substance Use Topics  . Alcohol use: Not on file   No family history on file. Current Outpatient Medications on File Prior to Visit  Medication Sig Dispense Refill  . clindamycin  (CLEOCIN ) 300 mg capsule TAKE 1 CAPSULE BY MOUTH TWICE DAILY FOR 2 WEEKS FOR FLARES    . dapsone 25 mg tablet Take 25 mg by mouth in the morning and at bedtime.    . diphenhydrAMINE  (BENADRYL ) 25 mg capsule Take one tablet every 4-6 hours as needed for nausea    . doxylamine -pyridoxine , vit B6, 10-10 mg tablet,delayed release (DR/EC) Take 1 tablet by mouth 1 (one) time each day.    . Humira,CF, Pen 40 mg/0.4 mL pen injector kit INJECT THE CONTENTS OF 1 PEN INTO THE SKIN ONCE EVERY WEEK    . HYDROcodone -acetaminophen  (NORCO) 5-325 mg tablet Take 1-2 tablets by mouth.    . hydrocortisone 2.5 % cream Apply to affected areas on the forehead daily    . ibuprofen  (ADVIL ,MOTRIN ) 800 mg tablet Take 800 mg by mouth every 8 (eight) hours if needed.    . metoclopramide  (REGLAN ) 5 mg tablet Take 5 mg by mouth.    . silver sulfADIAZINE (SILVADENE, SSD) 1 % cream Apply to affected areaS once or twice daily    . spironolactone (ALDACTONE) 50 mg tablet Take 1 pill daily after breakfast and dinner x 3 months supply    . triamcinolone (KENALOG) 0.1 %  ointment Apply to dry patches once a day. Never to the face    . valACYclovir (VALTREX) 500 mg tablet 1 tab po daily to prevent recurrences and if a lesion occurs take twice a day for 3 days     No current facility-administered medications on file prior to visit.   Medical Risk Assessment ASA GRADE: ASA 1 - Normal health patient  Subjective: Patient reports  Exam reveals #12,#13 need rct/pc/ crown   Objective:  Radiographs taken:  of area:   Additional findings/observations: #12 #13 need rct/pc/crown   Assessment: Pt was seen back in [place] asked for new bw  DR stated #12,#13 the decay was too close to nerve and pt would need rct/pc/crown  The rest of pending filling are okay   Plan: Pt need to come back for fillings and #12,#13 rct/pc/crown   Additional notes:  NV:   Treatment Providers * No providers found * DR.Seen Lorane Gosling DAI [facility] [address] [place] [address] [phone]

## 2023-06-26 NOTE — Telephone Encounter (Signed)
 This message is to make you aware that [facility] has tried to contact your patient Wanda Boone multiple times for a refill with no response. Her last refill for Humira was on 05/02/2023.   Due to the lapse in fills, there may be a concern for compliance.   If you have any additional contact information, please let us  know. At this point, we will no longer proactively reach out to the patient for refills as part of our patient management program. However, if the patient contacts the pharmacy we will re-establish care.  Thank you!

## 2023-08-15 ENCOUNTER — Encounter (HOSPITAL_BASED_OUTPATIENT_CLINIC_OR_DEPARTMENT_OTHER): Payer: Self-pay | Admitting: Emergency Medicine

## 2023-08-15 ENCOUNTER — Other Ambulatory Visit: Payer: Self-pay

## 2023-08-15 ENCOUNTER — Emergency Department (HOSPITAL_BASED_OUTPATIENT_CLINIC_OR_DEPARTMENT_OTHER)
Admission: EM | Admit: 2023-08-15 | Discharge: 2023-08-15 | Disposition: A | Discharge status: Home / Self Care | Payer: Medicaid Other | Attending: Emergency Medicine | Admitting: Emergency Medicine

## 2023-08-15 DIAGNOSIS — Z20828 Contact with and (suspected) exposure to other viral communicable diseases: Secondary | ICD-10-CM | POA: Insufficient documentation

## 2023-08-15 DIAGNOSIS — Z20822 Contact with and (suspected) exposure to covid-19: Secondary | ICD-10-CM | POA: Insufficient documentation

## 2023-08-15 DIAGNOSIS — Z91010 Allergy to peanuts: Secondary | ICD-10-CM | POA: Insufficient documentation

## 2023-08-15 LAB — RESP PANEL BY RT-PCR (RSV, FLU A&B, COVID)  RVPGX2
Influenza A by PCR: NEGATIVE
Influenza B by PCR: NEGATIVE
Resp Syncytial Virus by PCR: NEGATIVE
SARS Coronavirus 2 by RT PCR: NEGATIVE

## 2023-08-15 NOTE — ED Triage Notes (Signed)
Pt wants to be tested for flu bc her son was positive earlier today

## 2023-08-15 NOTE — ED Provider Notes (Signed)
[place] EM[facility]GENCY DEPARTMENT AT [facility] [place] Provider Note   CSN: [id number] Arrival date & time: 08/15/23  2032     History  Chief Complaint  Patient presents with   Influenza    Wanda Boone is a 28 y.o. female.  HPI    28 year old female comes in with chief complaint of flu exposure. Patient states that her son is sick with fevers, cough and there is a family who was diagnosed with flu.  Currently she has no symptoms, but wants to be checked for flu.  Home Medications Prior to Admission medications   Medication Sig Start Date End Date Taking? Authorizing Provider  amoxicillin (AMOXIL) 500 MG capsule Take 1 capsule (500 mg total) by mouth 3 (three) times daily. 09/08/20   Molpus, John, MD  Doxylamine-Pyridoxine 10-10 MG TBEC Take 1 tablet by mouth every 8 (eight) hours as needed. 03/24/22   Long, Arlyss Repress, MD  fluconazole (DIFLUCAN) 150 MG tablet Take 1 tablet if needed for vaginal yeast infection.  May repeat in 3 days if symptoms persist. 09/08/20   Molpus, Jonny Ruiz, MD  HYDROcodone-acetaminophen (NORCO) 5-325 MG tablet Take 1-2 tablets by mouth every 6 (six) hours as needed. 03/08/23   Geoffery Lyons, MD  ibuprofen (ADVIL) 800 MG tablet Take 1 tablet (800 mg total) by mouth every 8 (eight) hours as needed. 12/13/22   Elson Areas, PA-C  metoCLOPramide (REGLAN) 5 MG tablet Take 1 tablet (5 mg total) by mouth 4 (four) times daily for 10 doses. 04/20/23 04/23/23  Glendale Chard, DO  promethazine (PHENERGAN) 12.5 MG tablet Take 1 tablet (12.5 mg total) by mouth every 6 (six) hours as needed for nausea or vomiting. 08/14/22   Small, Brooke L, PA  dicyclomine (BENTYL) 20 MG tablet Take 1 tablet (20 mg total) by mouth 2 (two) times daily. 01/03/18 09/08/20  Aviva Kluver B, PA-C      Allergies    Peanut butter flavoring agent (non-screening), Red dye #40 (allura red), Cherry flavoring agent (non-screening), and Zofran [ondansetron]    Review of Systems   Review of Systems   All other systems reviewed and are negative.   Physical Exam Updated Vital Signs BP 110/80 (BP Location: Right Arm)   Pulse 89   Temp 98.8 F (37.1 C)   Resp 18   Ht 5\' 4"  (1.626 m)   Wt 99.8 kg   LMP 07/14/2023 (Exact Date)   SpO2 100%   Breastfeeding Unknown   BMI 37.76 kg/m  Physical Exam Vitals and nursing note reviewed.  Constitutional:      Appearance: She is well-developed.  HENT:     Head: Atraumatic.  Cardiovascular:     Rate and Rhythm: Normal rate.  Pulmonary:     Effort: Pulmonary effort is normal.  Musculoskeletal:     Cervical back: Normal range of motion and neck supple.  Skin:    General: Skin is warm and dry.  Neurological:     Mental Status: She is alert and oriented to person, place, and time.     ED Results / Procedures / Treatments   Labs (all labs ordered are listed, but only abnormal results are displayed) Labs Reviewed  RESP PANEL BY RT-PCR (RSV, FLU A&B, COVID)  [id number]    EKG None  Radiology No results found.  Procedures Procedures    Medications Ordered in ED Medications - No data to display  ED Course/ Medical Decision Making/ A&P  Medical Decision Making  28 year old patient comes in with chief complaint of exposure to flu.  Her son has confirmed flu in the ER.  Patient currently has no symptoms.  Discussed with her that her flu test right now is negative, but if she starts developing symptoms of fevers, chills, body aches then she has to presume she has a flu and this isolate and hydrate.  Final Clinical Impression(s) / ED Diagnoses Final diagnoses:  Exposure to the flu    Rx / DC Orders ED Discharge Orders     None         Derwood Kaplan, MD 08/15/23 2301

## 2023-08-15 NOTE — Discharge Instructions (Addendum)
Although you have been exposed to flu, currently your flu test was negative.  If you start developing fevers, body aches, sore throat then presume you have influenza.  You may consider reading information on influenza that is provided.

## 2023-09-15 IMAGING — DX DG CHEST 1V PORT
1 series · 1 of 1 positions shown · non-contrast
Comparison: None.

CLINICAL DATA: Cough.

EXAM:
PORTABLE CHEST 1 VIEW

[chest ap]
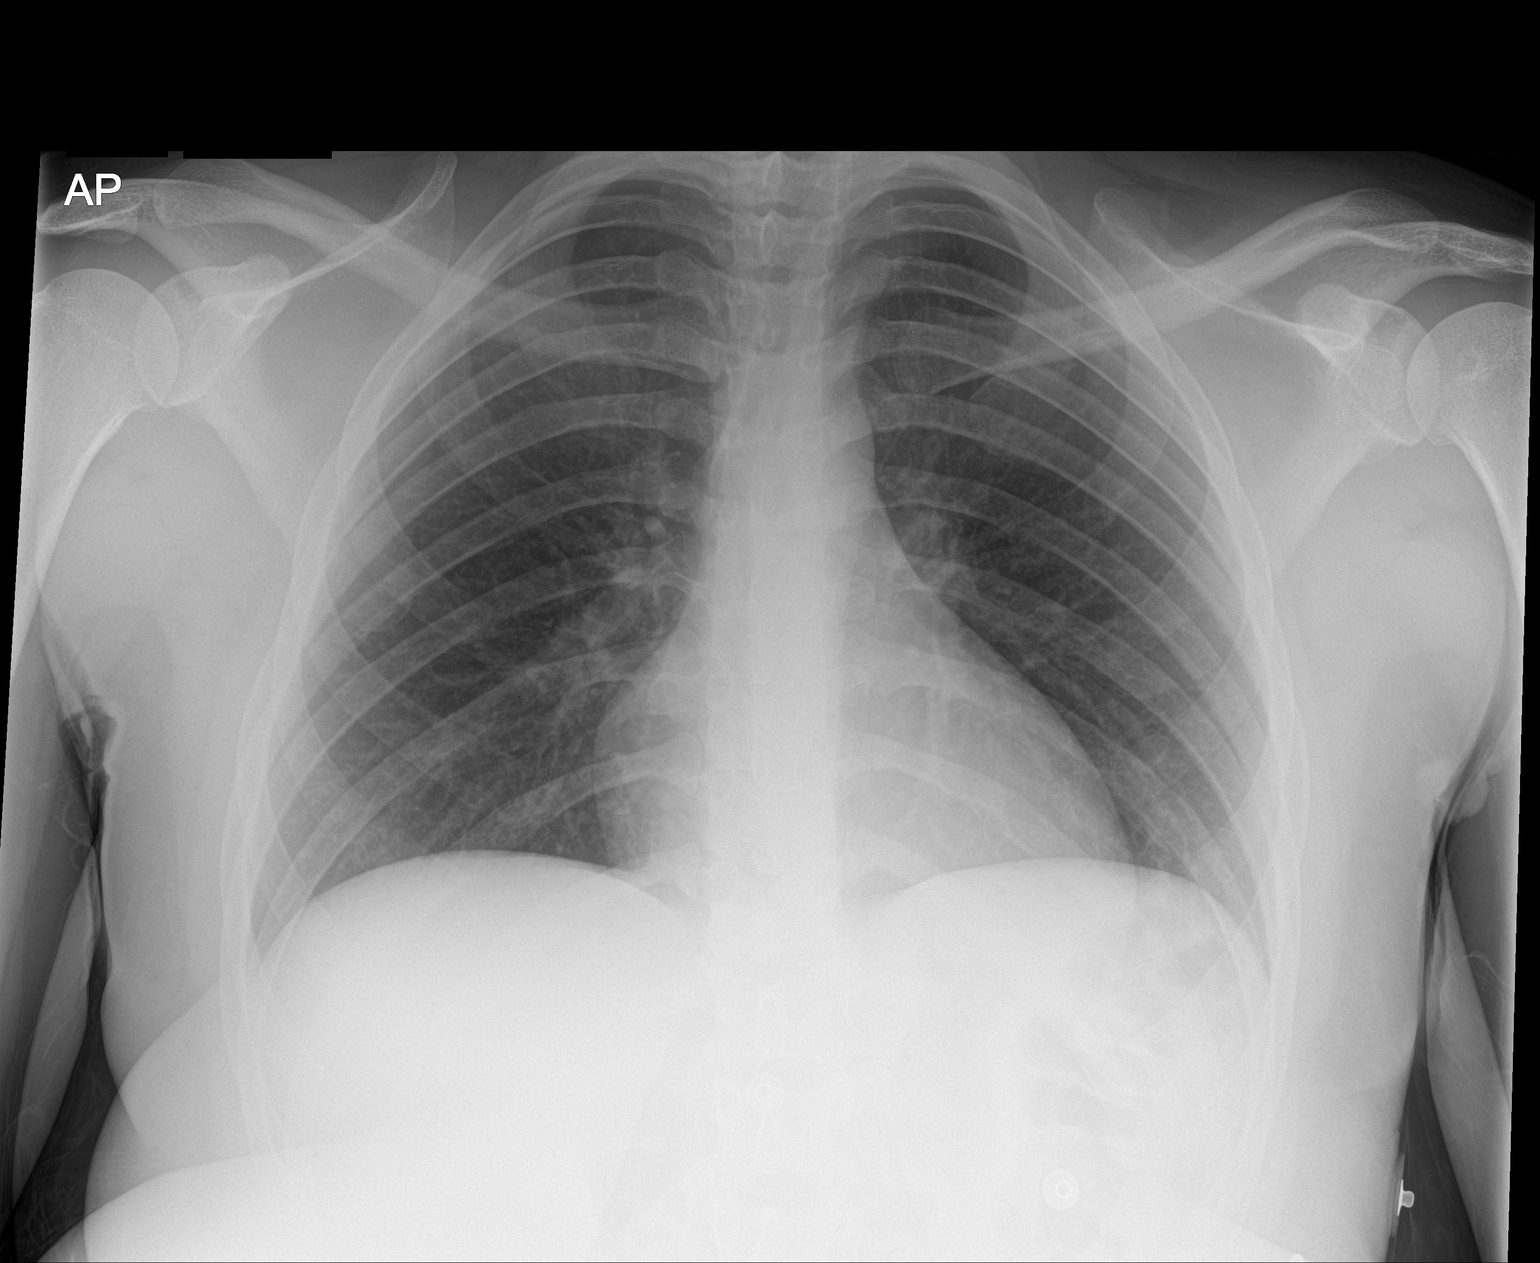

[1 of 1 positions shown; findings below may reference images not displayed]

FINDINGS: The heart size and mediastinal contours are within normal limits.
Both lungs are clear. The visualized skeletal structures are
unremarkable.
IMPRESSION: No active disease.

## 2024-05-28 ENCOUNTER — Emergency Department (HOSPITAL_BASED_OUTPATIENT_CLINIC_OR_DEPARTMENT_OTHER)

## 2024-05-28 ENCOUNTER — Other Ambulatory Visit: Payer: Self-pay

## 2024-05-28 ENCOUNTER — Encounter (HOSPITAL_BASED_OUTPATIENT_CLINIC_OR_DEPARTMENT_OTHER): Payer: Self-pay

## 2024-05-28 ENCOUNTER — Emergency Department (HOSPITAL_BASED_OUTPATIENT_CLINIC_OR_DEPARTMENT_OTHER)
Admission: EM | Admit: 2024-05-28 | Discharge: 2024-05-28 | Disposition: A | Discharge status: Home / Self Care | Attending: Emergency Medicine | Admitting: Emergency Medicine

## 2024-05-28 DIAGNOSIS — O4690 Antepartum hemorrhage, unspecified, unspecified trimester: Secondary | ICD-10-CM

## 2024-05-28 DIAGNOSIS — Z91010 Allergy to peanuts: Secondary | ICD-10-CM | POA: Diagnosis not present

## 2024-05-28 DIAGNOSIS — O99810 Abnormal glucose complicating pregnancy: Secondary | ICD-10-CM | POA: Diagnosis not present

## 2024-05-28 DIAGNOSIS — O208 Other hemorrhage in early pregnancy: Secondary | ICD-10-CM | POA: Diagnosis present

## 2024-05-28 DIAGNOSIS — Z3A08 8 weeks gestation of pregnancy: Secondary | ICD-10-CM | POA: Insufficient documentation

## 2024-05-28 LAB — URINALYSIS, ROUTINE W REFLEX MICROSCOPIC
Bilirubin Urine: NEGATIVE
Glucose, UA: NEGATIVE mg/dL
Ketones, ur: NEGATIVE mg/dL
Leukocytes,Ua: NEGATIVE
Nitrite: NEGATIVE
Protein, ur: NEGATIVE mg/dL
Specific Gravity, Urine: >=1.03 (ref 1.005–1.030)
pH: 5.5 (ref 5.0–8.0)

## 2024-05-28 LAB — CBC WITH DIFFERENTIAL/PLATELET
Abs Immature Granulocytes: 0.02 K/uL (ref 0.00–0.07)
Basophils Absolute: 0 K/uL (ref 0.0–0.1)
Basophils Relative: 0 %
Eosinophils Absolute: 0.1 K/uL (ref 0.0–0.5)
Eosinophils Relative: 1 %
HCT: 37.4 % (ref 36.0–46.0)
Hemoglobin: 12.9 g/dL (ref 12.0–15.0)
Immature Granulocytes: 0 %
Lymphocytes Relative: 45 %
Lymphs Abs: 3.2 K/uL (ref 0.7–4.0)
MCH: 32 pg (ref 26.0–34.0)
MCHC: 34.5 g/dL (ref 30.0–36.0)
MCV: 92.8 fL (ref 80.0–100.0)
Monocytes Absolute: 0.5 K/uL (ref 0.1–1.0)
Monocytes Relative: 7 %
Neutro Abs: 3.2 K/uL (ref 1.7–7.7)
Neutrophils Relative %: 47 %
Platelets: 206 K/uL (ref 150–400)
RBC: 4.03 MIL/uL (ref 3.87–5.11)
RDW: 12.1 % (ref 11.5–15.5)
WBC: 7 K/uL (ref 4.0–10.5)
nRBC: 0 % (ref 0.0–0.2)

## 2024-05-28 LAB — URINALYSIS, MICROSCOPIC (REFLEX)

## 2024-05-28 LAB — HCG, QUANTITATIVE, PREGNANCY: hCG, Beta Chain, Quant, S: 73687 m[IU]/mL — ABNORMAL HIGH (ref <5)

## 2024-05-28 LAB — COMPREHENSIVE METABOLIC PANEL WITH GFR
ALT: 16 U/L (ref 0–44)
AST: 22 U/L (ref 15–41)
Albumin: 4.1 g/dL (ref 3.5–5.0)
Alkaline Phosphatase: 56 U/L (ref 38–126)
Anion gap: 11 (ref 5–15)
BUN: 6 mg/dL (ref 6–20)
CO2: 19 mmol/L — ABNORMAL LOW (ref 22–32)
Calcium: 8.9 mg/dL (ref 8.9–10.3)
Chloride: 106 mmol/L (ref 98–111)
Creatinine, Ser: 0.7 mg/dL (ref 0.44–1.00)
GFR, Estimated: >60 mL/min (ref >60)
Glucose, Bld: 131 mg/dL — ABNORMAL HIGH (ref 70–99)
Potassium: 4.2 mmol/L (ref 3.5–5.1)
Sodium: 137 mmol/L (ref 135–145)
Total Bilirubin: 0.4 mg/dL (ref 0.0–1.2)
Total Protein: 6.8 g/dL (ref 6.5–8.1)

## 2024-05-28 LAB — LIPASE, BLOOD: Lipase: 16 U/L (ref 11–51)

## 2024-05-28 NOTE — ED Notes (Signed)
 D/c paperwork reviewed with pt, including follow up care.  All questions and/or concerns addressed at time of d/c.  No further needs expressed. . Pt verbalized understanding.  Pt provided grape juice with ice, per her request.  Ambulatory without assistance to ED exit, NAD.

## 2024-05-28 NOTE — ED Provider Notes (Signed)
 [place] [facility] [place] Provider Note   CSN: [id number] Arrival date & time: 05/28/24  1108     Patient presents with: Vaginal Bleeding   Wanda Boone is a 28 y.o. female with a past medical history significant for allergies who presents to the ED due to vaginal bleeding that started last night.  Patient currently [redacted]w[redacted]d gestation. LMP 9/12. Had a positive pregnancy test at PCP on 10/28. G5P2.  Admits to bright red blood when wiping last night. No clots. Now admits to some brown spotting.  Denies any abdominal pain.  No pelvic pain.  Denies vaginal discharge.  Has not had an ultrasound to confirm IUP.  Admits to some nausea however, no vomiting or diarrhea.  History obtained from patient and past medical records. No interpreter used during encounter.      Prior to Admission medications   Medication Sig Start Date End Date Taking? Authorizing Provider  amoxicillin  (AMOXIL ) 500 MG capsule Take 1 capsule (500 mg total) by mouth 3 (three) times daily. 09/08/20   Molpus, John, MD  Doxylamine -Pyridoxine  10-10 MG TBEC Take 1 tablet by mouth every 8 (eight) hours as needed. 03/24/22   Long, Fonda MATSU, MD  fluconazole  (DIFLUCAN ) 150 MG tablet Take 1 tablet if needed for vaginal yeast infection.  May repeat in 3 days if symptoms persist. 09/08/20   Molpus, Norleen, MD  HYDROcodone -acetaminophen  (NORCO) 5-325 MG tablet Take 1-2 tablets by mouth every 6 (six) hours as needed. 03/08/23   Geroldine Berg, MD  ibuprofen  (ADVIL ) 800 MG tablet Take 1 tablet (800 mg total) by mouth every 8 (eight) hours as needed. 12/13/22   Sofia, Leslie K, PA-C  metoCLOPramide  (REGLAN ) 5 MG tablet Take 1 tablet (5 mg total) by mouth 4 (four) times daily for 10 doses. 04/20/23 04/23/23  Cleotilde Perkins, DO  promethazine  (PHENERGAN ) 12.5 MG tablet Take 1 tablet (12.5 mg total) by mouth every 6 (six) hours as needed for nausea or vomiting. 08/14/22   Small, Brooke L, PA  dicyclomine  (BENTYL ) 20 MG tablet  Take 1 tablet (20 mg total) by mouth 2 (two) times daily. 01/03/18 09/08/20  Murray, Alyssa B, PA-C    Allergies: Peanut butter flavoring agent (non-screening), Red dye #40 (allura red), Cherry flavoring agent (non-screening), and Zofran  [ondansetron ]    Review of Systems  Gastrointestinal:  Negative for abdominal pain.  Genitourinary:  Positive for vaginal bleeding.    Updated Vital Signs BP 123/68   Pulse 88   Temp 98.5 F (36.9 C) (Oral)   Resp 18   LMP 07/14/2023 (Exact Date)   SpO2 100%   Physical Exam Vitals and nursing note reviewed.  Constitutional:      General: She is not in acute distress.    Appearance: She is not ill-appearing.  HENT:     Head: Normocephalic.  Eyes:     Pupils: Pupils are equal, round, and reactive to light.  Cardiovascular:     Rate and Rhythm: Normal rate and regular rhythm.     Pulses: Normal pulses.     Heart sounds: Normal heart sounds. No murmur heard.    No friction rub. No gallop.  Pulmonary:     Effort: Pulmonary effort is normal.     Breath sounds: Normal breath sounds.  Abdominal:     General: Abdomen is flat. There is no distension.     Palpations: Abdomen is soft.     Tenderness: There is no abdominal tenderness. There is no guarding or rebound.  Comments: Abdomen soft, nondistended, nontender to palpation in all quadrants without guarding or peritoneal signs. No rebound.   Musculoskeletal:        General: Normal range of motion.     Cervical back: Neck supple.  Skin:    General: Skin is warm and dry.  Neurological:     General: No focal deficit present.     Mental Status: She is alert.  Psychiatric:        Mood and Affect: Mood normal.        Behavior: Behavior normal.     (all labs ordered are listed, but only abnormal results are displayed) Labs Reviewed  URINALYSIS, ROUTINE W REFLEX MICROSCOPIC - Abnormal; Notable for the following components:      Result Value   Hgb urine dipstick MODERATE (*)    All other  components within normal limits  HCG, QUANTITATIVE, PREGNANCY - Abnormal; Notable for the following components:   hCG, Beta Chain, Quant, S 26,312 (*)    All other components within normal limits  COMPREHENSIVE METABOLIC PANEL WITH GFR - Abnormal; Notable for the following components:   CO2 19 (*)    Glucose, Bld 131 (*)    All other components within normal limits  URINALYSIS, MICROSCOPIC (REFLEX) - Abnormal; Notable for the following components:   Bacteria, UA FEW (*)    All other components within normal limits  CBC WITH DIFFERENTIAL/PLATELET  LIPASE, BLOOD    EKG: None  Radiology: US  OB LESS THAN 14 WEEKS WITH OB TRANSVAGINAL Result Date: 05/28/2024 EXAM: OBSTETRIC ULTRASOUND FIRST TRIMESTER CLINICAL HISTORY: Vaginal bleeding TECHNIQUE: Transabdominal first trimester obstetric pelvic duplex ultrasound was performed with real-time imaging, color flow Doppler imaging, and spectral analysis. COMPARISON: None provided. FINDINGS: UTERUS: No focal myometrial mass. GESTATIONAL SAC(S): Single normal appearing gestational sac. Small subchorionic hemorrhage is noted. YOLK SAC: Present EMBRYO(<11WK) /FETUS(>=11WK): Single CROWN RUMP LENGTH: 19.4 mm corresponding to gestational age [redacted] weeks 3 days. RATE OF CARDIAC ACTIVITY: 173 beats per minute. RIGHT OVARY: Possible corpus luteum cyst seen in right ovary. LEFT OVARY: Unremarkable. FREE FLUID: No free fluid. MEASUREMENTS ESTIMATED GESTATIONAL AGE BY CURRENT ULTRASOUND: 8 weeks 3 days. IMPRESSION: 1. Intrauterine pregnancy with embryonic/fetal cardiac activity and crown-rump length corresponding to gestational age [redacted] weeks 3 days. 2. Small subchorionic hemorrhage. Electronically signed by: Lynwood Seip MD 05/28/2024 01:30 PM EST RP Workstation: [id number]     Procedures   Medications Ordered in the ED - No data to display  Clinical Course as of 05/28/24 1406  Tue May 28, 2024  1230 HCG, Beta Denis Earleen RAMAN(!): 26,312 [CA]  1239 Hemoglobin: 12.9  [CA]  1239 WBC: 7.0 [CA]  1344 Hgb urine dipstickROLLEN): MODERATE [CA]  1344 Bacteria, UA(!): FEW [CA]  1344 WBC, UA: 0-5 [CA]    Clinical Course User Index [CA] Lorelle Aleck BROCKS, PA-C                                 Medical Decision Making Amount and/or Complexity of Data Reviewed External Data Reviewed: notes.    Details: PCP note and OBGYN telephone encounter Labs: ordered. Decision-making details documented in ED Course. Radiology: ordered and independent interpretation performed. Decision-making details documented in ED Course.   This patient presents to the ED for concern of vaginal bleeding in pregnancy, this involves an extensive number of treatment options, and is a complaint that carries with it a high risk of complications and morbidity.  The differential diagnosis includes ectopic pregnancy, subchorionic hematoma, threatened abortion, etc  28 year old female presents to the ED due to vaginal bleeding that started last night.  Patient currently [redacted]w[redacted]d gestation. LMP 9/12. Had a positive pregnancy test at PCP on 10/28. G5P2. No US  to confirm IUP this pregnancy. Denies pelvic and abdominal pain. Bright red blood last night while wiping and now brown spotting. No clots.  Upon arrival, vitals all within normal limits.  Patient well-appearing on exam.  Abdomen soft, nondistended, nontender.  Routine labs ordered.  Ultrasound to rule out ectopic pregnancy. Patient O+ per chart review from previous pregnancy.   CBC unremarkable.  No leukocytosis.  Normal hemoglobin.  CMP with hyperglycemia 131.  Normal anion gap.  Normal renal function.  Lipase normal.  UA Significant for moderate hematuria likely due to vaginal bleeding.  No evidence of infection.  Quantitative hCG 73,000.  Ultrasound personally reviewed and interpreted which demonstrates intrauterine pregnancy measuring 8 weeks and 3 days.  Does also demonstrate a small subchorionic hemorrhage which is likely causing patient's vaginal  bleeding.  No evidence of ectopic pregnancy.  Advised patient to follow-up with OB/GYN this week for further evaluation.  Start taking prenatal and only take Tylenol  as needed for pain.  No evidence of acute abdomen on exam.  Patient stable for discharge. Strict ED precautions discussed with patient. Patient states understanding and agrees to plan. Patient discharged home in no acute distress and stable vitals  Co morbidities that complicate the patient evaluation  Seasonal allergies  Social Determinants of Health:  Has PCP  Test / Admission - Considered:  Considered admission; however no evidence of ectopic on US . Vaginal bleeding likely from subchorionic hemorrhage     Final diagnoses:  Vaginal bleeding in pregnancy  [redacted] weeks gestation of pregnancy  Subchorionic hemorrhage of placenta in first trimester    ED Discharge Orders     None          Lorelle Aleck BROCKS, PA-C 05/28/24 1407    Franklyn Sid SAILOR, MD 05/28/24 1423

## 2024-05-28 NOTE — ED Triage Notes (Signed)
 Reports heavy bleeding last night, spotting this morning, denies abd cramping.   Approx 8 weeks preg

## 2024-05-28 NOTE — Discharge Instructions (Signed)
 It was a pleasure taking care of you today.  As discussed, your ultrasound showed an intrauterine pregnancy measuring 8 weeks and 3 days.  You also have a subchorionic hemorrhage which is likely causing your vaginal bleeding.  Please follow-up with OB/GYN this week for further evaluation.  Remain on pelvic rest until evaluated by OBGYN. Start taking a prenatal.  You may only take Tylenol  for pain.  Return to the ER for any worsening symptoms.

## 2024-06-10 ENCOUNTER — Other Ambulatory Visit: Payer: Self-pay

## 2024-06-10 ENCOUNTER — Encounter (HOSPITAL_BASED_OUTPATIENT_CLINIC_OR_DEPARTMENT_OTHER): Payer: Self-pay | Admitting: Emergency Medicine

## 2024-06-10 ENCOUNTER — Emergency Department (HOSPITAL_BASED_OUTPATIENT_CLINIC_OR_DEPARTMENT_OTHER): Admission: EM | Admit: 2024-06-10 | Discharge: 2024-06-10 | Disposition: A | Discharge status: Home / Self Care

## 2024-06-10 DIAGNOSIS — J029 Acute pharyngitis, unspecified: Secondary | ICD-10-CM | POA: Diagnosis present

## 2024-06-10 LAB — RESP PANEL BY RT-PCR (RSV, FLU A&B, COVID)  RVPGX2
Influenza A by PCR: NEGATIVE
Influenza B by PCR: NEGATIVE
Resp Syncytial Virus by PCR: NEGATIVE
SARS Coronavirus 2 by RT PCR: NEGATIVE

## 2024-06-10 LAB — GROUP A STREP BY PCR: Group A Strep by PCR: NOT DETECTED

## 2024-06-10 NOTE — ED Triage Notes (Signed)
 Sore throat and headache x 2 days .  [redacted] weeks pregnant .

## 2024-06-10 NOTE — Discharge Instructions (Signed)
 You were evaluated in the emergency room for sore throat nasal congestion.  Your exam is most consistent with viral illness.  This is expected to resolve without antibiotics.  Please follow with your OB/GYN as scheduled.  If you experience any new or worsening symptoms please return to the emergency room.

## 2024-06-10 NOTE — ED Provider Notes (Signed)
 [place] [facility] Provider Note   CSN: [id number] Arrival date & time: 06/10/24  0901     Patient presents with: Sore Throat   Wanda Boone is a 28 y.o. female [redacted] weeks gestation presents with complaints of nasal congestion, sore throat and intermittent headache x 2 days.  Denies significant cough.  No difficulty swallowing or breathing.  No new abdominal pain or abnormal vomiting.  Does not associate with any concerning neurological symptoms.  No recent sick contacts.    Sore Throat      Past Medical History:  Diagnosis Date   Seasonal allergies    Past Surgical History:  Procedure Laterality Date   arm surgery  cyst removed   sweat gland surgery       Prior to Admission medications   Medication Sig Start Date End Date Taking? Authorizing Provider  amoxicillin  (AMOXIL ) 500 MG capsule Take 1 capsule (500 mg total) by mouth 3 (three) times daily. 09/08/20   Molpus, John, MD  Doxylamine -Pyridoxine  10-10 MG TBEC Take 1 tablet by mouth every 8 (eight) hours as needed. 03/24/22   Long, Fonda MATSU, MD  fluconazole  (DIFLUCAN ) 150 MG tablet Take 1 tablet if needed for vaginal yeast infection.  May repeat in 3 days if symptoms persist. 09/08/20   Molpus, Norleen, MD  HYDROcodone -acetaminophen  (NORCO) 5-325 MG tablet Take 1-2 tablets by mouth every 6 (six) hours as needed. 03/08/23   Geroldine Berg, MD  ibuprofen  (ADVIL ) 800 MG tablet Take 1 tablet (800 mg total) by mouth every 8 (eight) hours as needed. 12/13/22   Sofia, Leslie K, PA-C  metoCLOPramide  (REGLAN ) 5 MG tablet Take 1 tablet (5 mg total) by mouth 4 (four) times daily for 10 doses. 04/20/23 04/23/23  Cleotilde Perkins, DO  promethazine  (PHENERGAN ) 12.5 MG tablet Take 1 tablet (12.5 mg total) by mouth every 6 (six) hours as needed for nausea or vomiting. 08/14/22   Small, Brooke L, PA  dicyclomine  (BENTYL ) 20 MG tablet Take 1 tablet (20 mg total) by mouth 2 (two) times daily. 01/03/18 09/08/20  Murray,  Alyssa B, PA-C    Allergies: Peanut butter flavoring agent (non-screening), Red dye #40 (allura red), Cherry flavoring agent (non-screening), and Zofran  [ondansetron ]    Review of Systems  HENT:  Positive for congestion.     Updated Vital Signs BP 115/76 (BP Location: Right Arm)   Pulse 100   Temp 97.7 F (36.5 C)   Resp 18   Wt 108.9 kg   LMP 07/14/2023 (Exact Date)   SpO2 100%   BMI 41.20 kg/m   Physical Exam Vitals and nursing note reviewed.  Constitutional:      General: She is not in acute distress.    Appearance: She is well-developed.  HENT:     Head: Normocephalic and atraumatic.     Mouth/Throat:     Comments: Uvula midline, no trismus or pooling of secretions, no exudate or erythema, no facial swelling, no abnormal phonation Eyes:     Conjunctiva/sclera: Conjunctivae normal.  Cardiovascular:     Rate and Rhythm: Normal rate and regular rhythm.     Heart sounds: No murmur heard. Pulmonary:     Effort: Pulmonary effort is normal. No respiratory distress.     Breath sounds: Normal breath sounds.  Abdominal:     Palpations: Abdomen is soft.     Tenderness: There is no abdominal tenderness.  Musculoskeletal:        General: No swelling.  Cervical back: Neck supple.  Skin:    General: Skin is warm and dry.     Capillary Refill: Capillary refill takes less than 2 seconds.  Neurological:     General: No focal deficit present.     Mental Status: She is alert.  Psychiatric:        Mood and Affect: Mood normal.     (all labs ordered are listed, but only abnormal results are displayed) Labs Reviewed  GROUP A STREP BY PCR  RESP PANEL BY RT-PCR (RSV, FLU A&B, COVID)  [id number]    EKG: None  Radiology: No results found.   Procedures   Medications Ordered in the ED - No data to display  Clinical Course as of 06/10/24 1124  Mon Jun 10, 2024  1114 Patient evaluated for sore throat, headache and nasal congestion over the past couple days.  Is not  associate with any difficulty swallowing or breathing.  No concerning neurological symptoms.  Upon arrival patient is mildly tachycardic, otherwise hemodynamically stable.  Her exam is entirely benign.  Respiratory panel and strep test are negative.  Suspect viral etiology.  Educated on supportive care.  Strict return precautions provided.  Patient is understanding agreement with plan. [JT]    Clinical Course User Index [JT] Donnajean Lynwood DEL, PA-C                                 Medical Decision Making  This patient presents to the ED with chief complaint(s) of sore throat.  The complaint involves an extensive differential diagnosis and also carries with it a high risk of complications and morbidity.   Pertinent past medical history as listed in HPI  The differential diagnosis includes  Based off exam and history do not suspect pneumonia, subarachnoid hemorrhage, CVA, TIA, Ludwig's angina or deep space infection.  Strep test and respiratory panel are negative. Additional history obtained: Records reviewed Care Everywhere/External Records  Disposition:   Patient will be discharged home. The patient has been appropriately medically screened and/or stabilized in the ED. I have low suspicion for any other emergent medical condition which would require further screening, evaluation or treatment in the ED or require inpatient management. At time of discharge the patient is hemodynamically stable and in no acute distress. I have discussed work-up results and diagnosis with patient and answered all questions. Patient is agreeable with discharge plan. We discussed strict return precautions for returning to the emergency department and they verbalized understanding.     Social Determinants of Health:   none  This note was dictated with voice recognition software.  Despite best efforts at proofreading, errors may have occurred which can change the documentation meaning.       Final diagnoses:   Sore throat    ED Discharge Orders     None          Donnajean Lynwood DEL, PA-C 06/10/24 1124    Neysa Caron PARAS, [place] 06/10/24 1209

## 2024-07-10 ENCOUNTER — Other Ambulatory Visit: Payer: Self-pay

## 2024-07-10 ENCOUNTER — Encounter (HOSPITAL_BASED_OUTPATIENT_CLINIC_OR_DEPARTMENT_OTHER): Payer: Self-pay

## 2024-07-10 ENCOUNTER — Emergency Department (HOSPITAL_BASED_OUTPATIENT_CLINIC_OR_DEPARTMENT_OTHER)
Admission: EM | Admit: 2024-07-10 | Discharge: 2024-07-10 | Disposition: A | Discharge status: Home / Self Care | Attending: Emergency Medicine | Admitting: Emergency Medicine

## 2024-07-10 ENCOUNTER — Emergency Department (HOSPITAL_BASED_OUTPATIENT_CLINIC_OR_DEPARTMENT_OTHER)

## 2024-07-10 DIAGNOSIS — R519 Headache, unspecified: Secondary | ICD-10-CM | POA: Insufficient documentation

## 2024-07-10 LAB — BASIC METABOLIC PANEL WITH GFR
Anion gap: 12 (ref 5–15)
BUN: 8 mg/dL (ref 6–20)
CO2: 21 mmol/L — ABNORMAL LOW (ref 22–32)
Calcium: 9.1 mg/dL (ref 8.9–10.3)
Chloride: 104 mmol/L (ref 98–111)
Creatinine, Ser: 0.63 mg/dL (ref 0.44–1.00)
GFR, Estimated: >60 mL/min
Glucose, Bld: 106 mg/dL — ABNORMAL HIGH (ref 70–99)
Potassium: 4 mmol/L (ref 3.5–5.1)
Sodium: 137 mmol/L (ref 135–145)

## 2024-07-10 LAB — CBC
HCT: 35.1 % — ABNORMAL LOW (ref 36.0–46.0)
Hemoglobin: 12.4 g/dL (ref 12.0–15.0)
MCH: 32.2 pg (ref 26.0–34.0)
MCHC: 35.3 g/dL (ref 30.0–36.0)
MCV: 91.2 fL (ref 80.0–100.0)
Platelets: 196 K/uL (ref 150–400)
RBC: 3.85 MIL/uL — ABNORMAL LOW (ref 3.87–5.11)
RDW: 12.9 % (ref 11.5–15.5)
WBC: 7.6 K/uL (ref 4.0–10.5)
nRBC: 0 % (ref 0.0–0.2)

## 2024-07-10 MED ORDER — PROCHLORPERAZINE EDISYLATE 10 MG/2ML IJ SOLN
10.0000 mg | Freq: Once | INTRAMUSCULAR | Status: AC
  Administered 2024-07-10: 10 mg via INTRAVENOUS
  Filled 2024-07-10: qty 2

## 2024-07-10 MED ORDER — DOXYLAMINE-PYRIDOXINE 10-10 MG PO TBEC: 1.0000 | DELAYED_RELEASE_TABLET | Freq: Two times a day (BID) | ORAL | 0 refills | Status: AC | PRN

## 2024-07-10 MED ORDER — SODIUM CHLORIDE 0.9 % IV BOLUS
1000.0000 mL | Freq: Once | INTRAVENOUS | Status: AC
  Administered 2024-07-10: 1000 mL via INTRAVENOUS

## 2024-07-10 MED ORDER — DIPHENHYDRAMINE HCL 50 MG/ML IJ SOLN
25.0000 mg | Freq: Once | INTRAMUSCULAR | Status: AC
  Administered 2024-07-10: 25 mg via INTRAVENOUS
  Filled 2024-07-10: qty 1

## 2024-07-10 MED ORDER — IOHEXOL 350 MG/ML SOLN
100.0000 mL | Freq: Once | INTRAVENOUS | Status: AC | PRN
  Administered 2024-07-10: 75 mL via INTRAVENOUS

## 2024-07-10 NOTE — ED Provider Notes (Signed)
 " [facility] Emergency Department Provider Note MRN:  [id number]  Arrival date & time: 07/10/2024     Chief Complaint   Headache History of Present Illness   Wanda Boone is a 28 y.o. year-old female with no pertinent past medical history presenting to the ED with chief complaint of headache.  Headache sudden onset at 8 PM this evening.  Does not normally experience headaches.  Headache was immediately severe, associated with nausea.  Denies issues with speech or vision, no numbness or weakness to the arms or legs, no pain anywhere else.  Review of Systems  A thorough review of systems was obtained and all systems are negative except as noted in the HPI and PMH.   Patient's Health History    Past Medical History:  Diagnosis Date   Seasonal allergies     Past Surgical History:  Procedure Laterality Date   arm surgery  cyst removed   sweat gland surgery      History reviewed. No pertinent family history.  Social History   Socioeconomic History   Marital status: Single    Spouse name: Not on file   Number of children: Not on file   Years of education: Not on file   Highest education level: Not on file  Occupational History   Not on file  Tobacco Use   Smoking status: Some Days    Types: Cigarettes   Smokeless tobacco: Never  Vaping Use   Vaping status: Never Used  Substance and Sexual Activity   Alcohol use: No   Drug use: Yes    Types: Marijuana   Sexual activity: Not on file  Other Topics Concern   Not on file  Social History Narrative   Not on file   Social Drivers of Health   Tobacco Use: High Risk (07/10/2024)   Patient History    Smoking Tobacco Use: Some Days    Smokeless Tobacco Use: Never    Passive Exposure: Not on file  Financial Resource Strain: Not on file  Food Insecurity: Unknown (03/19/2024)   Received from [facility]   Epic    Within the past 12 months, you worried that your food would run out before you  got money to buy more: Patient declined to answer    Within the past 12 months, the food you bought just didn't last and you didn't have money to get more. : Patient declined to answer  Transportation Needs: Not on file (03/19/2024)  Physical Activity: Not on file  Stress: Not on file  Social Connections: Not on file  Intimate Partner Violence: Not on file  Depression (EYV7-0): Not on file  Alcohol Screen: Not on file  Housing: Not on file (03/19/2024)  Utilities: Unknown (03/19/2024)   Received from [facility]   Utilities    In the past 12 months has the electric, gas, oil, or water company threatened to shut off services in your home? : Patient declined to answer  Health Literacy: Not on file     Physical Exam   Vitals:   07/10/24 0500 07/10/24 0602  BP: 122/75 113/85  Pulse:  (!) 112  Resp:  18  Temp:  98.3 F (36.8 C)  SpO2:  100%    CONSTITUTIONAL: Well-appearing, NAD NEURO/PSYCH:  Alert and oriented x 3, no focal deficits EYES:  eyes equal and reactive ENT/NECK:  no LAD, no JVD CARDIO: Regular rate, well-perfused, normal S1 and S2 PULM:  CTAB no wheezing or rhonchi GI/GU:  non-distended, non-tender MSK/SPINE:  No gross deformities, no edema SKIN:  no rash, atraumatic   *Additional and/or pertinent findings included in MDM below  Diagnostic and Interventional Summary    EKG Interpretation Date/Time:    Ventricular Rate:    PR Interval:    QRS Duration:    QT Interval:    QTC Calculation:   R Axis:      Text Interpretation:         Labs Reviewed  CBC - Abnormal; Notable for the following components:      Result Value   RBC 3.85 (*)    HCT 35.1 (*)    All other components within normal limits  BASIC METABOLIC PANEL WITH GFR - Abnormal; Notable for the following components:   CO2 21 (*)    Glucose, Bld 106 (*)    All other components within normal limits    CT ANGIO HEAD NECK W WO CM  Final Result    CT VENOGRAM HEAD  Final Result       Medications  sodium chloride  0.9 % bolus 1,000 mL (1,000 mLs Intravenous New Bag/Given 07/10/24 0427)  diphenhydrAMINE  (BENADRYL ) injection 25 mg (25 mg Intravenous Given 07/10/24 0428)  prochlorperazine  (COMPAZINE ) injection 10 mg (10 mg Intravenous Given 07/10/24 0428)  iohexol  (OMNIPAQUE ) 350 MG/ML injection 100 mL (75 mLs Intravenous Contrast Given 07/10/24 0539)     Procedures  /  Critical Care Procedures  ED Course and Medical Decision Making  Initial Impression and Ddx Differential diagnosis includes subarachnoid hemorrhage, carotid dissection, dural venous thrombus is another consideration especially given patient's pregnancy status.  [redacted] weeks pregnant.  Headache is worse when standing up.  I discussed the pros and cons of CT imaging with the patient given her pregnancy status, proceeding with the CT imaging.  Past medical/surgical history that increases complexity of ED encounter: Pregnant  Interpretation of Diagnostics I personally reviewed the Laboratory Testing and my interpretation is as follows: No significant blood count or electrolyte disturbance.  CTA and CTV imaging are unremarkable.  Patient Reassessment and Ultimate Disposition/Management     On reassessment headache is resolved, she is feeling back to normal.  Not having any visual disturbance or vision loss of any kind, doubt pseudotumor.  Return precautions discussed, discharged home.  Patient management required discussion with the following services or consulting groups:  None  Complexity of Problems Addressed Acute illness or injury that poses threat of life of bodily function  Additional Data Reviewed and Analyzed Further history obtained from: Prior labs/imaging results  Additional Factors Impacting ED Encounter Risk Consideration of hospitalization  [name], MD [facility] Emergency Medicine [facility] [email or web address]  Final Clinical Impressions(s) / ED Diagnoses      ICD-10-CM   1. Acute nonintractable headache, unspecified headache type  R51.9       ED Discharge Orders          Ordered    Doxylamine -Pyridoxine  10-10 MG TBEC  2 times daily PRN        07/10/24 9388             Discharge Instructions Discussed with and Provided to Patient:     Discharge Instructions      You were evaluated in the Emergency Department and after careful evaluation, we did not find any emergent condition requiring admission or further testing in the hospital.  Your exam/testing today is overall reassuring.  Use the doxylamine  pyridoxine  medication for nausea, plenty of fluids as we discussed,  follow-up with your regular doctors especially if headaches continue.  Please return to the Emergency Department if you experience any worsening of your condition.   Thank you for allowing us  to be a part of your care.       Theadore Ozell HERO, MD 07/10/24 [phone]  "

## 2024-07-10 NOTE — ED Triage Notes (Signed)
 Pt reports headache, onset tonight around 8pm. Denies hx of headaches. She took Tylenol  but it has not helped. + nausea, denies vision changes. She reports she is [redacted] weeks pregnant.

## 2024-07-10 NOTE — Discharge Instructions (Addendum)
 You were evaluated in the Emergency Department and after careful evaluation, we did not find any emergent condition requiring admission or further testing in the hospital.  Your exam/testing today is overall reassuring.  Use the doxylamine  pyridoxine  medication for nausea, plenty of fluids as we discussed, follow-up with your regular doctors especially if headaches continue.  Please return to the Emergency Department if you experience any worsening of your condition.   Thank you for allowing us  to be a part of your care.

## 2024-08-13 ENCOUNTER — Encounter (HOSPITAL_BASED_OUTPATIENT_CLINIC_OR_DEPARTMENT_OTHER): Payer: Self-pay

## 2024-08-13 ENCOUNTER — Emergency Department (HOSPITAL_BASED_OUTPATIENT_CLINIC_OR_DEPARTMENT_OTHER)
Admission: EM | Admit: 2024-08-13 | Discharge: 2024-08-13 | Disposition: A | Discharge status: Home / Self Care | Attending: Emergency Medicine | Admitting: Emergency Medicine

## 2024-08-13 ENCOUNTER — Other Ambulatory Visit: Payer: Self-pay

## 2024-08-13 DIAGNOSIS — Z91010 Allergy to peanuts: Secondary | ICD-10-CM | POA: Diagnosis not present

## 2024-08-13 DIAGNOSIS — O26892 Other specified pregnancy related conditions, second trimester: Secondary | ICD-10-CM | POA: Diagnosis present

## 2024-08-13 DIAGNOSIS — O91112 Abscess of breast associated with pregnancy, second trimester: Secondary | ICD-10-CM | POA: Insufficient documentation

## 2024-08-13 DIAGNOSIS — Z3A20 20 weeks gestation of pregnancy: Secondary | ICD-10-CM | POA: Diagnosis not present

## 2024-08-13 DIAGNOSIS — N611 Abscess of the breast and nipple: Secondary | ICD-10-CM

## 2024-08-13 HISTORY — DX: Gestational diabetes mellitus in pregnancy, unspecified control: O24.419

## 2024-08-13 MED ORDER — LIDOCAINE HCL (PF) 1 % IJ SOLN
5.0000 mL | Freq: Once | INTRAMUSCULAR | Status: AC
  Administered 2024-08-13: 5 mL
  Filled 2024-08-13: qty 5

## 2024-08-13 MED ORDER — ACETAMINOPHEN 325 MG PO TABS
650.0000 mg | ORAL_TABLET | Freq: Once | ORAL | Status: AC
  Administered 2024-08-13: 650 mg via ORAL
  Filled 2024-08-13: qty 2

## 2024-08-13 MED ORDER — LIDOCAINE HCL (PF) 1 % IJ SOLN
5.0000 mL | INTRAMUSCULAR | Status: DC
  Administered 2024-08-13: 5 mL

## 2024-08-13 NOTE — Discharge Instructions (Signed)
 You were seen in the emergency department for the abscess on your breast.  We were able to drain this for you in the emergency department.  You can do warm compresses at home for 15 to 20 minutes at a time 3-4 times a day to help encourage drainage.  You can take Tylenol  as needed for pain.  You should follow-up with your GYN to have your wound rechecked and make sure that it is healing appropriately.  You should return to the emergency department if you are having fevers, spreading redness from the area, it is reaccumulating and no longer draining or if you have any other new or concerning symptoms.

## 2024-08-13 NOTE — Telephone Encounter (Signed)
 Pt 19wks 4d. Phone call to IR dept @ [facility]. Spoke with Latoya who says the IR provider is not coming into the office today, therefore no appt's are available today.   Phone call to IR @ [facility]. Spoke directly to Dr. Vanice (IR) who says,we do not provide this services as outpatient, as we would only do so for a pt seen in ED that needed this service. The [facility] should be able to do this service for the pt.  Phone call to [facility]. Spoke with Laketha who says the records would have to be sent to them first to determine if pt can be seen, however there are no available appt's today.   Pt informed per above and instructed to go to ED for eval. Pt voices understanding and agrees.    Arne SQUIBB, MSN, RN

## 2024-08-13 NOTE — ED Provider Notes (Signed)
 " Wanda Boone   CSN: [id number] Arrival date & time: 08/13/24  1314     Patient presents with: Abscess   Wanda Boone is a 29 y.o. female.   Patient is a 29 year old female with a past medical history of hidradenitis suppurativa and is approximately [redacted] weeks pregnant presenting to the emergency department with concern for an abscess to her left breast.  She states that she started to notice some swelling and discomfort about 3 days ago and the swelling has gotten bigger and more painful since.  She states that she has not had anything draining.  She denies any surrounding redness or warmth or fever.  She states that she was diagnosed with gestational diabetes but has had no other complications this pregnancy.  The history is provided by the patient.  Abscess      Prior to Admission medications  Medication Sig Start Date End Date Taking? Authorizing Provider  amoxicillin  (AMOXIL ) 500 MG capsule Take 1 capsule (500 mg total) by mouth 3 (three) times daily. 09/08/20   Molpus, John, MD  Doxylamine -Pyridoxine  10-10 MG TBEC Take 1 tablet by mouth 2 (two) times daily as needed. 07/10/24   Theadore Ozell HERO, MD  fluconazole  (DIFLUCAN ) 150 MG tablet Take 1 tablet if needed for vaginal yeast infection.  May repeat in 3 days if symptoms persist. 09/08/20   Molpus, Norleen, MD  HYDROcodone -acetaminophen  (NORCO) 5-325 MG tablet Take 1-2 tablets by mouth every 6 (six) hours as needed. 03/08/23   Geroldine Berg, MD  ibuprofen  (ADVIL ) 800 MG tablet Take 1 tablet (800 mg total) by mouth every 8 (eight) hours as needed. 12/13/22   Sofia, Leslie K, PA-C  metoCLOPramide  (REGLAN ) 5 MG tablet Take 1 tablet (5 mg total) by mouth 4 (four) times daily for 10 doses. 04/20/23 04/23/23  Cleotilde Perkins, DO  promethazine  (PHENERGAN ) 12.5 MG tablet Take 1 tablet (12.5 mg total) by mouth every 6 (six) hours as needed for nausea or vomiting. 08/14/22   Small, Brooke L, PA   dicyclomine  (BENTYL ) 20 MG tablet Take 1 tablet (20 mg total) by mouth 2 (two) times daily. 01/03/18 09/08/20  Jason Fish B, PA-C    Allergies: Peanut butter flavoring agent (non-screening), Red dye #40 (allura red), Cherry flavoring agent (non-screening), and Zofran  [ondansetron ]    Review of Systems  Updated Vital Signs BP 119/62 (BP Location: Right Arm)   Pulse (!) 122   Temp 98.4 F (36.9 C) (Oral)   Resp 20   LMP 07/14/2023 (Exact Date)   SpO2 98%   Physical Exam Vitals and nursing Boone reviewed.  Constitutional:      General: She is not in acute distress.    Appearance: Normal appearance.  HENT:     Head: Normocephalic.     Nose: Nose normal.  Eyes:     Extraocular Movements: Extraocular movements intact.  Pulmonary:     Effort: Pulmonary effort is normal.  Abdominal:     General: Abdomen is flat.  Musculoskeletal:        General: Normal range of motion.     Cervical back: Normal range of motion.  Skin:    General: Skin is warm and dry.     Comments: ~3x3 cm area of palpable fluctuance and tenderness to L lower breast near inframammary fold, no surrounding erythema or warmth, no active drainage, no nipple involvement   Neurological:     Mental Status: She is alert and oriented to person,  place, and time.  Psychiatric:        Mood and Affect: Mood normal.        Behavior: Behavior normal.     (all labs ordered are listed, but only abnormal results are displayed) Labs Reviewed - No data to display  EKG: None  Radiology: No results found.   SABRAUltrasound ED Soft Tissue  Date/Time: 08/13/2024 1:41 PM  Performed by: Ellouise Richerd POUR, DO Authorized by: Ellouise Richerd POUR, DO   Procedure details:    Indications: localization of abscess     Transverse view:  Visualized   Longitudinal view:  Visualized   Images: archived   Location:    Location: breast     Side:  Left Findings:     abscess present    no cellulitis present    no foreign body  present .Incision and Drainage  Date/Time: 08/13/2024 2:22 PM  Performed by: Ellouise Richerd POUR, DO Authorized by: Ellouise Richerd POUR, DO   Consent:    Consent obtained:  Verbal   Consent given by:  Patient   Risks, benefits, and alternatives were discussed: yes     Risks discussed:  Bleeding, incomplete drainage, pain and infection   Alternatives discussed:  No treatment and delayed treatment Universal protocol:    Procedure explained and questions answered to patient or proxy's satisfaction: yes     Patient identity confirmed:  Verbally with patient Location:    Type:  Abscess   Size:  3x3x1 cm   Location:  Trunk   Trunk location:  L breast Pre-procedure details:    Skin preparation:  Chlorhexidine with alcohol Sedation:    Sedation type:  None Anesthesia:    Anesthesia method:  Local infiltration   Local anesthetic:  5 mL lidocaine  (PF) 1 % Procedure type:    Complexity:  Simple Procedure details:    Ultrasound guidance: yes     Needle aspiration: no     Incision types:  Stab incision   Incision depth:  Dermal   Wound management:  Probed and deloculated   Drainage:  Purulent   Drainage amount:  Moderate   Wound treatment:  Wound left open   Packing materials:  None Post-procedure details:    Procedure completion:  Tolerated well, no immediate complications    Medications Ordered in the ED  lidocaine  (PF) (XYLOCAINE ) 1 % injection 5 mL (5 mLs Infiltration Given by Other 08/13/24 1341)  acetaminophen  (TYLENOL ) tablet 650 mg (650 mg Oral Given 08/13/24 1340)    Clinical Course as of 08/13/24 1424  Tue Aug 13, 2024  1423 Patient tolerated I&D well. Has already been prescribed keflex  by GYN. She is stable for discharge with outpatient follow up. [VK]    Clinical Course User Index [VK] Kingsley, Yishai Rehfeld K, DO                                 Medical Decision Making This patient presents to the ED with chief complaint(s) of breast abscess with pertinent past  medical history of HS, [redacted] weeks pregnant which further complicates the presenting complaint. The complaint involves an extensive differential diagnosis and also carries with it a high risk of complications and morbidity.    The differential diagnosis includes abscess, no signs of cellulitis, cyst    Additional history obtained: Additional history obtained from family Records reviewed Care Everywhere/External Records  ED Course and Reassessment: On patient's arrival she is tachycardic  and otherwise hemodynamically stable, mildly uncomfortable appearing but in no acute distress.  Patient does have an area of palpable fluctuance and tenderness to her left lower breast concerning for abscess.  Bedside ultrasound confirmed approximately 3 x 3 x 1 cm fluid filled pocket.  Patient will have I&D performed  Independent labs interpretation:  N/A  Independent visualization of imaging: - N/A  Consultation: - Consulted or discussed management/test interpretation w/ external professional: N/A  Consideration for admission or further workup: Patient has no emergent conditions requiring admission or further work-up at this time and is stable for discharge home with primary care follow-up  Social Determinants of health: N/A    Risk OTC drugs. Prescription drug management.       Final diagnoses:  Breast abscess    ED Discharge Orders     None          Ellouise Richerd POUR, DO 08/13/24 1424  "

## 2024-08-13 NOTE — ED Triage Notes (Signed)
 Reports abscess under L breast for 2 days.  Denies drainage or fevers
